# Patient Record
Sex: Male | Born: 1970 | Race: White | Hispanic: No | Marital: Married | Smoking: Former smoker
Health system: Southern US, Community
[De-identification: ages and names within clinical notes are randomized; demographics above are authoritative.]

## PROBLEM LIST (undated history)

## (undated) DIAGNOSIS — J45909 Unspecified asthma, uncomplicated: Secondary | ICD-10-CM

## (undated) DIAGNOSIS — G4730 Sleep apnea, unspecified: Secondary | ICD-10-CM

## (undated) DIAGNOSIS — J189 Pneumonia, unspecified organism: Secondary | ICD-10-CM

## (undated) DIAGNOSIS — M1990 Unspecified osteoarthritis, unspecified site: Secondary | ICD-10-CM

## (undated) DIAGNOSIS — Z87442 Personal history of urinary calculi: Secondary | ICD-10-CM

## (undated) DIAGNOSIS — M75101 Unspecified rotator cuff tear or rupture of right shoulder, not specified as traumatic: Secondary | ICD-10-CM

## (undated) DIAGNOSIS — E785 Hyperlipidemia, unspecified: Secondary | ICD-10-CM

## (undated) HISTORY — PX: KNEE SURGERY: SHX244

## (undated) HISTORY — DX: Hyperlipidemia, unspecified: E78.5

## (undated) HISTORY — DX: Unspecified osteoarthritis, unspecified site: M19.90

## (undated) HISTORY — DX: Sleep apnea, unspecified: G47.30

## (undated) HISTORY — DX: Unspecified asthma, uncomplicated: J45.909

---
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
## 1998-08-05 ENCOUNTER — Ambulatory Visit (HOSPITAL_COMMUNITY): Admission: RE | Admit: 1998-08-05 | Discharge: 1998-08-05 | Payer: Self-pay | Admitting: Family Medicine

## 1998-08-05 ENCOUNTER — Encounter: Payer: Self-pay | Admitting: Family Medicine

## 2004-02-12 ENCOUNTER — Emergency Department: Payer: Self-pay | Admitting: Emergency Medicine

## 2004-03-18 ENCOUNTER — Ambulatory Visit (HOSPITAL_COMMUNITY): Admission: RE | Admit: 2004-03-18 | Discharge: 2004-03-18 | Payer: Self-pay | Admitting: Orthopedic Surgery

## 2006-01-09 HISTORY — PX: KNEE SURGERY: SHX244

## 2011-08-04 ENCOUNTER — Encounter (HOSPITAL_COMMUNITY): Payer: Self-pay | Admitting: Emergency Medicine

## 2011-08-04 ENCOUNTER — Observation Stay (HOSPITAL_COMMUNITY)
Admission: EM | Admit: 2011-08-04 | Discharge: 2011-08-05 | Disposition: A | Discharge status: Home / Self Care | Payer: 59 | Attending: Emergency Medicine | Admitting: Emergency Medicine

## 2011-08-04 DIAGNOSIS — M7989 Other specified soft tissue disorders: Principal | ICD-10-CM | POA: Insufficient documentation

## 2011-08-04 DIAGNOSIS — R110 Nausea: Secondary | ICD-10-CM | POA: Insufficient documentation

## 2011-08-04 DIAGNOSIS — L0390 Cellulitis, unspecified: Secondary | ICD-10-CM

## 2011-08-04 LAB — BASIC METABOLIC PANEL
BUN: 13 mg/dL (ref 6–23)
CO2: 26 mEq/L (ref 19–32)
Calcium: 9.3 mg/dL (ref 8.4–10.5)
Chloride: 97 mEq/L (ref 96–112)
Creatinine, Ser: 0.88 mg/dL (ref 0.50–1.35)
GFR calc Af Amer: >90 mL/min (ref >90)
GFR calc non Af Amer: >90 mL/min (ref >90)
Glucose, Bld: 102 mg/dL — ABNORMAL HIGH (ref 70–99)
Potassium: 3.9 mEq/L (ref 3.5–5.1)
Sodium: 135 mEq/L (ref 135–145)

## 2011-08-04 LAB — CBC
HCT: 40.5 % (ref 39.0–52.0)
Hemoglobin: 14.5 g/dL (ref 13.0–17.0)
MCH: 31.9 pg (ref 26.0–34.0)
MCHC: 35.8 g/dL (ref 30.0–36.0)
MCV: 89.2 fL (ref 78.0–100.0)
Platelets: 328 10*3/uL (ref 150–400)
RBC: 4.54 MIL/uL (ref 4.22–5.81)
RDW: 11.9 % (ref 11.5–15.5)
WBC: 14.4 10*3/uL — ABNORMAL HIGH (ref 4.0–10.5)

## 2011-08-04 MED ORDER — MORPHINE SULFATE 4 MG/ML IJ SOLN: 4.0000 mg | INTRAMUSCULAR | Status: DC | PRN

## 2011-08-04 MED ORDER — ENOXAPARIN SODIUM 150 MG/ML ~~LOC~~ SOLN
1.5000 mg/kg | SUBCUTANEOUS | Status: DC
  Administered 2011-08-04: 130 mg via SUBCUTANEOUS
  Filled 2011-08-04: qty 1

## 2011-08-04 MED ORDER — ONDANSETRON HCL 4 MG/2ML IJ SOLN
4.0000 mg | Freq: Four times a day (QID) | INTRAMUSCULAR | Status: DC | PRN
Start: 2011-08-04 — End: 2011-08-05

## 2011-08-04 MED ORDER — ACETAMINOPHEN 325 MG PO TABS: 650.0000 mg | ORAL_TABLET | ORAL | Status: DC | PRN

## 2011-08-04 MED ORDER — HYDROCODONE-ACETAMINOPHEN 5-325 MG PO TABS
1.0000 | ORAL_TABLET | Freq: Once | ORAL | Status: AC
  Administered 2011-08-04: 1 via ORAL
  Filled 2011-08-04: qty 1

## 2011-08-04 NOTE — ED Notes (Signed)
PT. REFUSED IV ACCESS AT THIS TIME , STATES " I'M NOT IN PAIN ".

## 2011-08-04 NOTE — ED Notes (Signed)
Patient to [facility] ED with C/O swelling and redness in his right lower leg for 1 week.  Swelling waxes and wanes.  Patient states that his leg was more swollen and red yesterday.  Patient stated that he applied ice and the swelling went down.  Denies dyspnea. Right leg noted to be swollen from knee to ankle.

## 2011-08-04 NOTE — ED Provider Notes (Signed)
Medical screening examination/treatment/procedure(s) were conducted as a shared visit with non-physician practitioner(s) and myself.  I personally evaluated the patient during the encounter    Nelia Shi, MD 08/04/11 2212

## 2011-08-04 NOTE — ED Notes (Signed)
S. WOLFE PA AT BEDSIDE SPEAKING WITH PT. AND FAMILY ON PLAN OF CARE .

## 2011-08-04 NOTE — ED Notes (Signed)
C/o R leg swelling, pain, and warm to touch since last Thursday.  No known injury. 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

## 2011-08-04 NOTE — ED Provider Notes (Signed)
History     CSN: [id number]  Arrival date & time 08/04/11  Jordan Shaffer   First MD Initiated Contact with Patient 08/04/11 1848      Chief Complaint  Patient presents with  . Leg Swelling    (Consider location/radiation/quality/duration/timing/severity/associated sxs/prior treatment) The history is provided by the patient.  41 y/o M presents to ED with c/c RLE swelling x 8 days. 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 Waxing and waning somewhat, but generally worsening over time. Began around the right knee and has spread to encompass the entire extremity. Assoc with erythema, excess warmth. Has been feeling generally run down for the last couple of days, but denies any fever or chills. Did have nausea yesterday, resolved today. Denies weakness or numbness to the leg/foot. Pain worse with ambulation, palpation of extremity. No alleviating factors. Has been using ice, ibuprofen with transient improvement. No known personal or family hx DVT/PE. No recent travel or prolonged immobilization, he is a non-smoker. Contacted PCP but they could not see him for another week.  History reviewed. No pertinent past medical history.  Past Surgical History  Procedure Date  . Knee surgery     No family history on file.  History  Substance Use Topics  . Smoking status: Never Smoker   . Smokeless tobacco: Not on file  . Alcohol Use: Yes      Review of Systems 10 systems reviewed and are negative for acute change except as noted in the HPI.  Allergies  Review of patient's allergies indicates no known allergies.  Home Medications   Current Outpatient Rx  Name Route Sig Dispense Refill  . ASPIRIN 81 MG PO CHEW Oral Chew 81 mg by mouth daily.    . IBUPROFEN 200 MG PO TABS Oral Take 200 mg by mouth every 6 (six) hours as needed. For pain    . LANSOPRAZOLE PO Oral Take 1 tablet by mouth daily.    . ADULT MULTIVITAMIN W/MINERALS CH Oral Take 1 tablet by mouth daily.    Marland Kitchen  NAPROXEN SODIUM 220 MG PO TABS Oral Take 220-440 mg by mouth daily as needed. For pain    . OMEGA-3-ACID ETHYL ESTERS 1 G PO CAPS Oral Take 2 g by mouth daily.    Marland Kitchen OVER THE COUNTER MEDICATION Oral Take 2 capsules by mouth daily. CINNAMON 1000MG  WITH CHROMIUM      BP 130/87  Pulse 89  Temp 98.1 F (36.7 C) (Oral)  SpO2 97%  Physical Exam  Nursing note reviewed. Constitutional: He is oriented to person, place, and time. He appears well-developed and well-nourished. No distress.       Vital signs are reviewed and are normal.  HENT:  Head: Normocephalic and atraumatic.  Right Ear: External ear normal.  Left Ear: External ear normal.       MMM  Eyes: Conjunctivae are normal.  Neck: Neck supple.  Cardiovascular: Normal rate, regular rhythm and normal heart sounds.        Bilateral radial and DP pulses are 2+  Pulmonary/Chest: Effort normal and breath sounds normal. No respiratory distress. He has no wheezes.  Abdominal: Soft. He exhibits no distension. There is no tenderness.  Musculoskeletal: Normal range of motion. He exhibits edema and tenderness.       RLE with 1+pitting edema from knee to ankle, erythema circumferentially from mid-thigh to ankle with mod TTP to calf.   Neurological: He is alert and oriented to person, place, and time.  Sensation intact to light touch in BLE. MAEW. Strength preserved in major muscle groups of BLE.  Skin: Skin is warm and dry. There is erythema.    ED Course  Procedures (including critical care time)  Labs Reviewed  CBC - Abnormal; Notable for the following:    WBC 14.4 (*)     All other components within normal limits  BASIC METABOLIC PANEL - Abnormal; Notable for the following:    Glucose, Bld 102 (*)     All other components within normal limits  D-DIMER, QUANTITATIVE - Abnormal; Notable for the following:    D-Dimer, Quant 0.86 (*)     All other components within normal limits  PROTIME-INR  APTT   No results found.     MDM   RLE swelling in fairly healthy male with no known injury. Waxing/waning nature and no obvious wounds, not c/w cellulitis. No predisposing RF for DVT, but clinical exam concerning, Wells criteria 2, d-dimer elevated. Pt is placed on DVT protocol with administration of 1.5mg /kg lovenox and plan for venous duplex in AM. Afebrile but slight leukocytosis, plan for CBC recheck in AM. Pt and family agreeable with this plan. Report given to PA Narvaez in CDU.        Shaaron Adler, [place] 08/04/11 2205

## 2011-08-04 NOTE — ED Notes (Signed)
PA at bedside.

## 2011-08-05 DIAGNOSIS — M7989 Other specified soft tissue disorders: Secondary | ICD-10-CM

## 2011-08-05 DIAGNOSIS — M79609 Pain in unspecified limb: Secondary | ICD-10-CM

## 2011-08-05 LAB — CBC
Hemoglobin: 14 g/dL (ref 13.0–17.0)
MCH: 32.7 pg (ref 26.0–34.0)
MCV: 89.5 fL (ref 78.0–100.0)
RBC: 4.28 MIL/uL (ref 4.22–5.81)

## 2011-08-05 MED ORDER — SULFAMETHOXAZOLE-TRIMETHOPRIM 800-160 MG PO TABS: 1.0000 | ORAL_TABLET | Freq: Two times a day (BID) | ORAL | Status: AC

## 2011-08-05 MED ORDER — CEPHALEXIN 500 MG PO CAPS: 500.0000 mg | ORAL_CAPSULE | Freq: Four times a day (QID) | ORAL | Status: AC

## 2011-08-05 NOTE — Progress Notes (Signed)
VASCULAR LAB PRELIMINARY  PRELIMINARY  PRELIMINARY  PRELIMINARY  Right lower extremity venous Doppler completed.    Preliminary report:  There is no DVT or SVT noted in the right lower extremity.  Jordan Shaffer, 08/05/2011, 7:49 AM

## 2011-08-05 NOTE — ED Notes (Signed)
Pt states his mother suggested to him that he may have strep in his knee.  Pt's mother was diagnosed with the same in the past.

## 2011-08-05 NOTE — ED Provider Notes (Signed)
Jordan Shaffer is a 41 y.o. male be evaluated for right leg swelling, and redness. Doppler study ordered last night, is negative for DVT. He has had mild, nausea without vomiting. No fever, or chills. No preceding or causative factors are known. He has not previously had this problem. Right leg is diffusey swollen from above the knee to the foot. He has intact distal pulses, perfusion and sensation. There is no right thigh, or groin swelling. The right foot is normal; there is no evident portal for infection. No palpable groin adenopathy. He is nontoxic in appearance.  Medical decision-making: Nonspecific Right leg Swelling. Differential diagnosis includes superficial thrombophlebitis, cellulitis, bursitis. We'll cover for cellulitis. Doubt metabolic instability, serious bacterial infection or impending vascular collapse; the patient is stable for discharge.  Plan: Home Medications- Septra, Keflex; Home Treatments- strict elevation; Recommended follow up- PCP in 5 days as scheduled  Flint Melter, MD 08/05/11 1016

## 2011-08-05 NOTE — ED Notes (Signed)
Ordered breakfast tray  

## 2017-02-15 ENCOUNTER — Ambulatory Visit: Payer: Managed Care, Other (non HMO) | Admitting: Urology

## 2017-02-15 ENCOUNTER — Encounter: Payer: Self-pay | Admitting: Urology

## 2017-02-15 VITALS — BP 126/69 | HR 73 | Ht 69.0 in | Wt 190.0 lb

## 2017-02-15 DIAGNOSIS — N529 Male erectile dysfunction, unspecified: Secondary | ICD-10-CM

## 2017-02-15 MED ORDER — SILDENAFIL CITRATE 20 MG PO TABS: ORAL_TABLET | ORAL | 0 refills | Status: DC

## 2017-02-15 NOTE — Progress Notes (Signed)
02/15/2017 4:09 PM   Jordan Shaffer 1970-06-12 [id number]  Referring provider: Evon SlackGaines, Thomas C, PA-C [address] [place], [place] [id number]  Chief Complaint  Patient presents with  . Erectile Dysfunction    New Patient    HPI: 47 year old male seen in consultation at the request of Cranston NeighborChris Gaines PA-C for evaluation of erectile dysfunction.  He has a history of chronic low back pain and underwent bilateral L3 and L4 medial branch, and L5 dorsal ramus radiofrequency neurotomy in early December 2018.  He states after the procedure he had voiding difficulty and difficulty achieving and maintaining an erection.  His voiding symptoms subsequently resolved.  He currently complains of partial erections which are typically not firm enough for penetration.  He has had no prior problems with the ED.  He denies diabetes, hypertension.  He has a history of tobacco use is at 1-2 cigars/day for 2 years and quit 14 years ago.   PMH: Past Medical History:  Diagnosis Date  . Arthritis   . Asthma   . Hyperlipemia   . Sleep apnea     Surgical History: Past Surgical History:  Procedure Laterality Date  . KNEE SURGERY      Home Medications:  Allergies as of 02/15/2017   No Known Allergies     Medication List        Accurate as of 02/15/17  4:09 PM. Always use your most recent med list.          aspirin 81 MG chewable tablet Chew 81 mg by mouth daily.   multivitamin with minerals Tabs tablet Take 1 tablet by mouth daily.   naproxen sodium 220 MG tablet Commonly known as:  ALEVE Take 220-440 mg by mouth daily as needed. For pain       Allergies: No Known Allergies  Family History: Family History  Problem Relation Age of Onset  . Diabetes Father   . Heart disease Father     Social History:  reports that  has never smoked. he has never used smokeless tobacco. He reports that he drinks alcohol. He reports that he does not use drugs.  ROS: UROLOGY Frequent Urination?:  No Hard to postpone urination?: No Burning/pain with urination?: Yes Get up at night to urinate?: No Leakage of urine?: Yes Urine stream starts and stops?: No Trouble starting stream?: No Do you have to strain to urinate?: No Blood in urine?: No Urinary tract infection?: No Sexually transmitted disease?: No Injury to kidneys or bladder?: No Painful intercourse?: Yes Weak stream?: No Erection problems?: Yes Penile pain?: Yes  Gastrointestinal Nausea?: Yes Vomiting?: No Indigestion/heartburn?: Yes Diarrhea?: No Constipation?: No  Constitutional Fever: No Night sweats?: No Weight loss?: No Fatigue?: Yes  Skin Skin rash/lesions?: No Itching?: No  Eyes Blurred vision?: No Double vision?: No  Ears/Nose/Throat Sore throat?: No Sinus problems?: No  Hematologic/Lymphatic Swollen glands?: No Easy bruising?: No  Cardiovascular Leg swelling?: No Chest pain?: No  Respiratory Cough?: No Shortness of breath?: Yes  Endocrine Excessive thirst?: Yes  Musculoskeletal Back pain?: Yes Joint pain?: Yes  Neurological Headaches?: No Dizziness?: No  Psychologic Depression?: No Anxiety?: Yes  Physical Exam: BP 126/69   Pulse 73   Ht 5\' 9"  (1.753 m)   Wt 190 lb (86.2 kg)   BMI 28.06 kg/m   Constitutional:  Alert and oriented, No acute distress. HEENT: [place] AT, moist mucus membranes.  Trachea midline, no masses. Cardiovascular: No clubbing, cyanosis, or edema. Respiratory: Normal respiratory effort, no increased  work of breathing. GI: Abdomen is soft, nontender, nondistended, no abdominal masses GU: No CVA tenderness.  Penis without lesions.  No palpable plaques.  Testes descended bilaterally without masses or tenderness.  Prostate 35 g, smooth without nodules.  Bulbocavernosus reflex intact. Skin: No rashes, bruises or suspicious lesions. Lymph: No cervical or inguinal adenopathy. Neurologic: Grossly intact, no focal deficits, moving all 4  extremities. Psychiatric: Normal mood and affect.  Laboratory Data: Lab Results  Component Value Date   WBC 11.3 (H) 08/05/2011   HGB 14.0 08/05/2011   HCT 38.3 (L) 08/05/2011   MCV 89.5 08/05/2011   PLT 310 08/05/2011    Lab Results  Component Value Date   CREATININE 0.88 08/04/2011     Assessment & Plan:  47 year old male with acute onset erectile dysfunction occurring after bilateral L3 and L4 medial branch, and L5 dorsal ramus radiofrequency neurotomy.  His MRI also showed lumbosacral degenerative disc disease.  In a brief literature search I was unable to find any articles regarding ED as a complication of this procedure.  His lumbosacral degenerative disc disease would be more of a factor.  I did discuss treatment options and that most patients initially desire a medication trial.  He was interested in pursuing an Rx for generic sildenafil was sent to his pharmacy.  Follow-up approximately 1 month.    Riki Altes, MD  [facility] [address] [place], [place] [phone]

## 2017-02-22 ENCOUNTER — Encounter: Payer: Self-pay | Admitting: Urology

## 2018-02-24 ENCOUNTER — Emergency Department
Admission: EM | Admit: 2018-02-24 | Discharge: 2018-02-24 | Disposition: A | Discharge status: Home / Self Care | Payer: Managed Care, Other (non HMO) | Attending: Emergency Medicine | Admitting: Emergency Medicine

## 2018-02-24 ENCOUNTER — Encounter: Payer: Self-pay | Admitting: Emergency Medicine

## 2018-02-24 ENCOUNTER — Other Ambulatory Visit: Payer: Self-pay

## 2018-02-24 DIAGNOSIS — Y9389 Activity, other specified: Secondary | ICD-10-CM | POA: Diagnosis not present

## 2018-02-24 DIAGNOSIS — Z7982 Long term (current) use of aspirin: Secondary | ICD-10-CM | POA: Insufficient documentation

## 2018-02-24 DIAGNOSIS — S0993XA Unspecified injury of face, initial encounter: Secondary | ICD-10-CM | POA: Diagnosis present

## 2018-02-24 DIAGNOSIS — K047 Periapical abscess without sinus: Secondary | ICD-10-CM | POA: Insufficient documentation

## 2018-02-24 DIAGNOSIS — J45909 Unspecified asthma, uncomplicated: Secondary | ICD-10-CM | POA: Diagnosis not present

## 2018-02-24 DIAGNOSIS — K0889 Other specified disorders of teeth and supporting structures: Secondary | ICD-10-CM

## 2018-02-24 DIAGNOSIS — S025XXA Fracture of tooth (traumatic), initial encounter for closed fracture: Secondary | ICD-10-CM | POA: Diagnosis not present

## 2018-02-24 DIAGNOSIS — S025XXB Fracture of tooth (traumatic), initial encounter for open fracture: Secondary | ICD-10-CM

## 2018-02-24 DIAGNOSIS — Y999 Unspecified external cause status: Secondary | ICD-10-CM | POA: Diagnosis not present

## 2018-02-24 DIAGNOSIS — X58XXXA Exposure to other specified factors, initial encounter: Secondary | ICD-10-CM | POA: Insufficient documentation

## 2018-02-24 DIAGNOSIS — Y929 Unspecified place or not applicable: Secondary | ICD-10-CM | POA: Diagnosis not present

## 2018-02-24 MED ORDER — AMOXICILLIN-POT CLAVULANATE 875-125 MG PO TABS: 1.0000 | ORAL_TABLET | Freq: Two times a day (BID) | ORAL | 0 refills | Status: DC

## 2018-02-24 MED ORDER — HYDROCODONE-ACETAMINOPHEN 5-325 MG PO TABS: 1.0000 | ORAL_TABLET | Freq: Three times a day (TID) | ORAL | 0 refills | Status: DC | PRN

## 2018-02-24 MED ORDER — KETOROLAC TROMETHAMINE 10 MG PO TABS: 10.0000 mg | ORAL_TABLET | Freq: Three times a day (TID) | ORAL | 0 refills | Status: DC | PRN

## 2018-02-24 NOTE — ED Provider Notes (Signed)
[facility] Emergency Department Provider Note ____________________________________________  Time seen: 1010  I have reviewed the triage vital signs and the nursing notes.  HISTORY  Chief Complaint  Dental Pain   HPI Jordan Shaffer is a 48 y.o. male presents to the ER today with complaint of left lower jaw pain and swelling.  He reports he broke a tooth about 1 week ago while eating an M&M.  He is scheduled to have a root canal on Tuesday.  He reports the back pain became so unbearable that he could not wait.  He describes the pain as throbbing.  The pain radiates into his left ear.  He denies ear pain, drainage or decreased hearing.  He denies fever but has had chills.  He has taken Tylenol and Ibuprofen OTC with minimal relief.  Past Medical History:  Diagnosis Date  . Arthritis   . Asthma   . Hyperlipemia   . Sleep apnea     There are no active problems to display for this patient.   Past Surgical History:  Procedure Laterality Date  . KNEE SURGERY      Prior to Admission medications   Medication Sig Start Date End Date Taking? Authorizing Provider  amoxicillin-clavulanate (AUGMENTIN) 875-125 MG tablet Take 1 tablet by mouth 2 (two) times daily. 02/24/18   Lorre Munroe, NP  aspirin 81 MG chewable tablet Chew 81 mg by mouth daily.    [provider]  ketorolac (TORADOL) 10 MG tablet Take 1 tablet (10 mg total) by mouth every 8 (eight) hours as needed. 02/24/18   Lorre Munroe, NP  Multiple Vitamin (MULTIVITAMIN WITH MINERALS) TABS Take 1 tablet by mouth daily.    [provider]  naproxen sodium (ANAPROX) 220 MG tablet Take 220-440 mg by mouth daily as needed. For pain    [provider]  sildenafil (REVATIO) 20 MG tablet 2-5 tabs 1 hour prior to intercourse 02/15/17   Stoioff, Verna Czech, MD    Allergies Patient has no known allergies.  Family History  Problem Relation Age of Onset  . Diabetes Father   . Heart disease  Father     Social History Social History   Tobacco Use  . Smoking status: Never Smoker  . Smokeless tobacco: Never Used  Substance Use Topics  . Alcohol use: Yes  . Drug use: No    Review of Systems  Constitutional: Positive for chills.  Negative for fever or body aches. ENT: Positive for left lower jaw pain and swelling.  Negative for sore throat. Cardiovascular: Negative for chest pain or chest tightness. Respiratory: Negative for cough or shortness of breath. G____________________________________________  PHYSICAL EXAM:  VITAL SIGNS: ED Triage Vitals  Enc Vitals Group     BP 02/24/18 0840 (!) 147/95     Pulse Rate 02/24/18 0840 76     Resp 02/24/18 0840 18     Temp 02/24/18 0854 98.2 F (36.8 C)     Temp Source 02/24/18 0854 Oral     SpO2 02/24/18 0840 99 %     Weight 02/24/18 0842 194 lb (88 kg)     Height 02/24/18 0842 5\' 9"  (1.753 m)     Head Circumference --      Peak Flow --      Pain Score 02/24/18 0840 7     Pain Loc --      Pain Edu? --      Excl. in GC? --     Constitutional:  Alert and oriented. Well appearing and in no distress. Head: Normocephalic without sinus tenderness. Ears: Canals clear. TMs intact bilaterally. Mouth/Throat: Mucous membranes are moist.  Broken tooth #19, left lower jaw with gingival swelling and redness. Hematological/Lymphatic/Immunological: No cervical lymphadenopathy. Cardiovascular: Normal rate, regular rhythm.  Respiratory: Normal respiratory effort. No wheezes/rales/rhonchi. Neurologic:   Normal speech and language. No gross focal neurologic deficits are appreciated. ____________________________________________  INITIAL IMPRESSION / ASSESSMENT AND PLAN / ED COURSE  Dental Pain secondary to Broken/Abscessed Tooth:  RX for Augmentin 875-125 mg PO BID x 10 days RT for Toradol 10 mg TID prn Can rinse mouth with salt water BID Follow up with dentist as scheduled ____________________________________________  FINAL  CLINICAL IMPRESSION(S) / ED DIAGNOSES  Final diagnoses:  Pain, dental  Open fracture of tooth, initial encounter  Dental abscess   Nicki Reaper, NP    Lorre Munroe, NP 02/24/18 1028    Nita Sickle, MD 02/24/18 [phone]

## 2018-02-24 NOTE — ED Triage Notes (Signed)
Pt presents to ED c/o pain and mild swelling to L lower teeth. States he is scheduled for root canal next Tuesday but pain became too severe to tolerate.

## 2018-02-24 NOTE — ED Notes (Signed)
Unable to obtain temp, pt states he was drinking cold Coke prior to entering lobby.

## 2018-02-24 NOTE — Discharge Instructions (Addendum)
You were seen today for a broken/abscessed tooth.  I have given you a prescription for antibiotics to take twice daily for the next 10 days.  I am giving you a prescription for pain medication to take every 8 hours as needed for pain and inflammation.  Please avoid OTC anti-inflammatory medications while taking Etodolac.  Follow-up with your dentist on Tuesday.

## 2018-10-22 ENCOUNTER — Other Ambulatory Visit (HOSPITAL_COMMUNITY)
Admission: RE | Admit: 2018-10-22 | Discharge: 2018-10-22 | Disposition: A | Discharge status: Home / Self Care | Payer: Managed Care, Other (non HMO) | Source: Ambulatory Visit | Attending: Orthopedic Surgery | Admitting: Orthopedic Surgery

## 2018-10-22 ENCOUNTER — Encounter (HOSPITAL_COMMUNITY): Payer: Self-pay | Admitting: *Deleted

## 2018-10-22 ENCOUNTER — Other Ambulatory Visit: Payer: Self-pay

## 2018-10-22 DIAGNOSIS — Z01812 Encounter for preprocedural laboratory examination: Secondary | ICD-10-CM | POA: Diagnosis not present

## 2018-10-22 DIAGNOSIS — Z20828 Contact with and (suspected) exposure to other viral communicable diseases: Secondary | ICD-10-CM | POA: Insufficient documentation

## 2018-10-22 LAB — SARS CORONAVIRUS 2 (TAT 6-24 HRS): SARS Coronavirus 2: NEGATIVE

## 2018-10-22 NOTE — Progress Notes (Signed)
Pt's wife picked up the Pre-surgery Ensure drink for pt.

## 2018-10-22 NOTE — Progress Notes (Signed)
Spoke with pt for pre-op call. Pt denies hx of Cardiac disease, HTN or Diabetes.  Pt had Covid test done this AM. Pt states he has been in quarantine since and understands he needs to continue until he comes for surgery tomorrow.   No pre-op orders in Epic. Have inboxed Dr. Stann Mainland and spoke with Judeen Hammans at his office. I told the pt that if Dr. Stann Mainland gets his orders in and if pt can have liquids after midnight I would call him and let him know. Otherwise he was instructed to be NPO after midnight tonight.

## 2018-10-22 NOTE — Progress Notes (Signed)
Called pt and instructed him that he may have clear liquids until 9:45 AM tomorrow. He is going to see if his wife can come by the hospital this afternoon by 7 PM and pick up Pre-surgery Ensure. Pt instructed to drink that by 9:45 AM. He voiced understanding.

## 2018-10-23 ENCOUNTER — Encounter (HOSPITAL_COMMUNITY): Payer: Self-pay

## 2018-10-23 ENCOUNTER — Ambulatory Visit (HOSPITAL_COMMUNITY): Payer: Worker's Compensation | Admitting: Certified Registered Nurse Anesthetist

## 2018-10-23 ENCOUNTER — Other Ambulatory Visit: Payer: Self-pay

## 2018-10-23 ENCOUNTER — Ambulatory Visit (HOSPITAL_COMMUNITY)
Admission: RE | Admit: 2018-10-23 | Discharge: 2018-10-23 | Disposition: A | Discharge status: Home / Self Care | Payer: Worker's Compensation | Attending: Orthopedic Surgery | Admitting: Orthopedic Surgery

## 2018-10-23 ENCOUNTER — Encounter (HOSPITAL_COMMUNITY)
Admission: RE | Disposition: A | Discharge status: Home / Self Care | Payer: Self-pay | Source: Home / Self Care | Attending: Orthopedic Surgery

## 2018-10-23 DIAGNOSIS — G4733 Obstructive sleep apnea (adult) (pediatric): Secondary | ICD-10-CM | POA: Insufficient documentation

## 2018-10-23 DIAGNOSIS — M7551 Bursitis of right shoulder: Secondary | ICD-10-CM | POA: Insufficient documentation

## 2018-10-23 DIAGNOSIS — Y990 Civilian activity done for income or pay: Secondary | ICD-10-CM | POA: Diagnosis not present

## 2018-10-23 DIAGNOSIS — Z791 Long term (current) use of non-steroidal anti-inflammatories (NSAID): Secondary | ICD-10-CM | POA: Diagnosis not present

## 2018-10-23 DIAGNOSIS — S46211A Strain of muscle, fascia and tendon of other parts of biceps, right arm, initial encounter: Secondary | ICD-10-CM | POA: Diagnosis not present

## 2018-10-23 DIAGNOSIS — Y9389 Activity, other specified: Secondary | ICD-10-CM | POA: Diagnosis not present

## 2018-10-23 DIAGNOSIS — M7521 Bicipital tendinitis, right shoulder: Secondary | ICD-10-CM | POA: Insufficient documentation

## 2018-10-23 DIAGNOSIS — E785 Hyperlipidemia, unspecified: Secondary | ICD-10-CM | POA: Diagnosis not present

## 2018-10-23 DIAGNOSIS — Z79899 Other long term (current) drug therapy: Secondary | ICD-10-CM | POA: Insufficient documentation

## 2018-10-23 DIAGNOSIS — Z9119 Patient's noncompliance with other medical treatment and regimen: Secondary | ICD-10-CM | POA: Diagnosis not present

## 2018-10-23 DIAGNOSIS — M7541 Impingement syndrome of right shoulder: Secondary | ICD-10-CM | POA: Diagnosis not present

## 2018-10-23 DIAGNOSIS — M25811 Other specified joint disorders, right shoulder: Secondary | ICD-10-CM | POA: Insufficient documentation

## 2018-10-23 DIAGNOSIS — M19011 Primary osteoarthritis, right shoulder: Secondary | ICD-10-CM | POA: Insufficient documentation

## 2018-10-23 DIAGNOSIS — W1789XA Other fall from one level to another, initial encounter: Secondary | ICD-10-CM | POA: Insufficient documentation

## 2018-10-23 DIAGNOSIS — Z87891 Personal history of nicotine dependence: Secondary | ICD-10-CM | POA: Insufficient documentation

## 2018-10-23 DIAGNOSIS — M75101 Unspecified rotator cuff tear or rupture of right shoulder, not specified as traumatic: Secondary | ICD-10-CM | POA: Diagnosis present

## 2018-10-23 DIAGNOSIS — J45909 Unspecified asthma, uncomplicated: Secondary | ICD-10-CM | POA: Insufficient documentation

## 2018-10-23 DIAGNOSIS — S46011A Strain of muscle(s) and tendon(s) of the rotator cuff of right shoulder, initial encounter: Secondary | ICD-10-CM | POA: Insufficient documentation

## 2018-10-23 HISTORY — DX: Pneumonia, unspecified organism: J18.9

## 2018-10-23 HISTORY — DX: Personal history of urinary calculi: Z87.442

## 2018-10-23 HISTORY — PX: SHOULDER ARTHROSCOPY WITH ROTATOR CUFF REPAIR AND SUBACROMIAL DECOMPRESSION: SHX5686

## 2018-10-23 HISTORY — DX: Unspecified rotator cuff tear or rupture of right shoulder, not specified as traumatic: M75.101

## 2018-10-23 LAB — HEMOGLOBIN: Hemoglobin: 15.4 g/dL (ref 13.0–17.0)

## 2018-10-23 SURGERY — SHOULDER ARTHROSCOPY WITH ROTATOR CUFF REPAIR AND SUBACROMIAL DECOMPRESSION
Anesthesia: General | Site: Shoulder | Laterality: Right

## 2018-10-23 MED ORDER — CEFAZOLIN SODIUM-DEXTROSE 2-4 GM/100ML-% IV SOLN
2.0000 g | INTRAVENOUS | Status: AC
  Administered 2018-10-23: 2 g via INTRAVENOUS

## 2018-10-23 MED ORDER — FENTANYL CITRATE (PF) 100 MCG/2ML IJ SOLN
INTRAMUSCULAR | Status: AC
  Administered 2018-10-23: 50 ug via INTRAVENOUS
  Filled 2018-10-23: qty 2

## 2018-10-23 MED ORDER — FENTANYL CITRATE (PF) 100 MCG/2ML IJ SOLN
50.0000 ug | Freq: Once | INTRAMUSCULAR | Status: AC
  Administered 2018-10-23: 12:00:00 50 ug via INTRAVENOUS

## 2018-10-23 MED ORDER — SUGAMMADEX SODIUM 200 MG/2ML IV SOLN
INTRAVENOUS | Status: DC | PRN
  Administered 2018-10-23: 200 mg via INTRAVENOUS

## 2018-10-23 MED ORDER — DEXAMETHASONE SODIUM PHOSPHATE 10 MG/ML IJ SOLN
INTRAMUSCULAR | Status: AC
  Filled 2018-10-23: qty 1

## 2018-10-23 MED ORDER — OXYCODONE HCL 5 MG PO TABS: 5.0000 mg | ORAL_TABLET | ORAL | 0 refills | Status: AC | PRN

## 2018-10-23 MED ORDER — ROCURONIUM BROMIDE 50 MG/5ML IV SOSY
PREFILLED_SYRINGE | INTRAVENOUS | Status: DC | PRN
  Administered 2018-10-23: 60 mg via INTRAVENOUS

## 2018-10-23 MED ORDER — MIDAZOLAM HCL 2 MG/2ML IJ SOLN
2.0000 mg | Freq: Once | INTRAMUSCULAR | Status: AC
  Administered 2018-10-23: 12:00:00 2 mg via INTRAVENOUS

## 2018-10-23 MED ORDER — CEFAZOLIN SODIUM-DEXTROSE 2-4 GM/100ML-% IV SOLN
INTRAVENOUS | Status: AC
  Filled 2018-10-23: qty 100

## 2018-10-23 MED ORDER — BUPIVACAINE HCL (PF) 0.5 % IJ SOLN
INTRAMUSCULAR | Status: DC | PRN
  Administered 2018-10-23: 15 mL via PERINEURAL

## 2018-10-23 MED ORDER — SODIUM CHLORIDE 0.9 % IV SOLN
INTRAVENOUS | Status: DC | PRN
  Administered 2018-10-23: 10 ug/min via INTRAVENOUS

## 2018-10-23 MED ORDER — ONDANSETRON HCL 4 MG/2ML IJ SOLN: 4.0000 mg | Freq: Once | INTRAMUSCULAR | Status: DC | PRN

## 2018-10-23 MED ORDER — 0.9 % SODIUM CHLORIDE (POUR BTL) OPTIME
TOPICAL | Status: DC | PRN
  Administered 2018-10-23: 1000 mL

## 2018-10-23 MED ORDER — LIDOCAINE 2% (20 MG/ML) 5 ML SYRINGE
INTRAMUSCULAR | Status: DC | PRN
  Administered 2018-10-23: 60 mg via INTRAVENOUS

## 2018-10-23 MED ORDER — ONDANSETRON 4 MG PO TBDP: 4.0000 mg | ORAL_TABLET | Freq: Three times a day (TID) | ORAL | 0 refills | Status: AC | PRN

## 2018-10-23 MED ORDER — LACTATED RINGERS IV SOLN
INTRAVENOUS | Status: DC
  Administered 2018-10-23 (×2): via INTRAVENOUS

## 2018-10-23 MED ORDER — PROPOFOL 10 MG/ML IV BOLUS
INTRAVENOUS | Status: DC | PRN
  Administered 2018-10-23: 200 mg via INTRAVENOUS

## 2018-10-23 MED ORDER — DEXAMETHASONE SODIUM PHOSPHATE 10 MG/ML IJ SOLN
INTRAMUSCULAR | Status: DC | PRN
  Administered 2018-10-23: 10 mg via INTRAVENOUS

## 2018-10-23 MED ORDER — MIDAZOLAM HCL 2 MG/2ML IJ SOLN
INTRAMUSCULAR | Status: AC
  Administered 2018-10-23: 2 mg via INTRAVENOUS
  Filled 2018-10-23: qty 2

## 2018-10-23 MED ORDER — OXYCODONE HCL 5 MG PO TABS
ORAL_TABLET | ORAL | Status: AC
  Filled 2018-10-23: qty 1

## 2018-10-23 MED ORDER — LIDOCAINE 2% (20 MG/ML) 5 ML SYRINGE
INTRAMUSCULAR | Status: AC
  Filled 2018-10-23: qty 5

## 2018-10-23 MED ORDER — ONDANSETRON HCL 4 MG/2ML IJ SOLN
INTRAMUSCULAR | Status: DC | PRN
  Administered 2018-10-23: 4 mg via INTRAVENOUS

## 2018-10-23 MED ORDER — OXYCODONE HCL 5 MG PO TABS
5.0000 mg | ORAL_TABLET | Freq: Once | ORAL | Status: AC | PRN
  Administered 2018-10-23: 5 mg via ORAL

## 2018-10-23 MED ORDER — BUPIVACAINE LIPOSOME 1.3 % IJ SUSP
INTRAMUSCULAR | Status: DC | PRN
  Administered 2018-10-23: 10 mL

## 2018-10-23 MED ORDER — SODIUM CHLORIDE 0.9 % IR SOLN
Status: DC | PRN
  Administered 2018-10-23 (×6): 3000 mL

## 2018-10-23 MED ORDER — OXYCODONE HCL 5 MG/5ML PO SOLN: 5.0000 mg | Freq: Once | ORAL | Status: AC | PRN

## 2018-10-23 MED ORDER — CHLORHEXIDINE GLUCONATE 4 % EX LIQD: 60.0000 mL | Freq: Once | CUTANEOUS | Status: DC

## 2018-10-23 MED ORDER — ONDANSETRON HCL 4 MG/2ML IJ SOLN
INTRAMUSCULAR | Status: AC
  Filled 2018-10-23: qty 2

## 2018-10-23 MED ORDER — FENTANYL CITRATE (PF) 100 MCG/2ML IJ SOLN: 25.0000 ug | INTRAMUSCULAR | Status: DC | PRN

## 2018-10-23 MED ORDER — FENTANYL CITRATE (PF) 250 MCG/5ML IJ SOLN
INTRAMUSCULAR | Status: AC
  Filled 2018-10-23: qty 5

## 2018-10-23 MED ORDER — PHENYLEPHRINE 40 MCG/ML (10ML) SYRINGE FOR IV PUSH (FOR BLOOD PRESSURE SUPPORT)
PREFILLED_SYRINGE | INTRAVENOUS | Status: AC
  Filled 2018-10-23: qty 10

## 2018-10-23 SURGICAL SUPPLY — 39 items
ALCOHOL 70% 16 OZ (MISCELLANEOUS) ×3 IMPLANT
ANCHOR SUT BIO SW 4.75X19.1 (Anchor) ×12 IMPLANT
BLADE SURG 11 STRL SS (BLADE) ×3 IMPLANT
CANNULA 5.75X7 CRYSTAL CLEAR (CANNULA) ×3 IMPLANT
CANNULA TWIST IN 8.25X7CM (CANNULA) ×3 IMPLANT
CLOSURE WOUND 1/2 X4 (GAUZE/BANDAGES/DRESSINGS) ×1
COVER WAND RF STERILE (DRAPES) IMPLANT
DRAPE INCISE IOBAN 66X45 STRL (DRAPES) IMPLANT
DRAPE STERI 35X30 U-POUCH (DRAPES) ×3 IMPLANT
DRAPE U-SHAPE 47X51 STRL (DRAPES) ×3 IMPLANT
DRSG PAD ABDOMINAL 8X10 ST (GAUZE/BANDAGES/DRESSINGS) ×9 IMPLANT
DURAPREP 26ML APPLICATOR (WOUND CARE) ×3 IMPLANT
GAUZE SPONGE 4X4 12PLY STRL (GAUZE/BANDAGES/DRESSINGS) ×3 IMPLANT
GLOVE BIO SURGEON STRL SZ7.5 (GLOVE) ×3 IMPLANT
GLOVE BIOGEL PI IND STRL 8 (GLOVE) ×1 IMPLANT
GLOVE BIOGEL PI INDICATOR 8 (GLOVE) ×2
GOWN STRL REUS W/ TWL LRG LVL3 (GOWN DISPOSABLE) ×3 IMPLANT
GOWN STRL REUS W/TWL LRG LVL3 (GOWN DISPOSABLE) ×6
KIT BASIN OR (CUSTOM PROCEDURE TRAY) ×3 IMPLANT
KIT TURNOVER KIT B (KITS) ×3 IMPLANT
MANIFOLD NEPTUNE II (INSTRUMENTS) ×3 IMPLANT
NEEDLE SCORPION MULTI FIRE (NEEDLE) ×3 IMPLANT
NEEDLE SPNL 18GX3.5 QUINCKE PK (NEEDLE) ×3 IMPLANT
NS IRRIG 1000ML POUR BTL (IV SOLUTION) ×3 IMPLANT
PACK SHOULDER (CUSTOM PROCEDURE TRAY) ×3 IMPLANT
PAD ARMBOARD 7.5X6 YLW CONV (MISCELLANEOUS) ×6 IMPLANT
PROBE APOLLO 90XL (SURGICAL WAND) ×3 IMPLANT
SLEEVE ARM SUSPENSION SYSTEM (MISCELLANEOUS) ×3 IMPLANT
SPONGE LAP 4X18 RFD (DISPOSABLE) ×3 IMPLANT
STRIP CLOSURE SKIN 1/2X4 (GAUZE/BANDAGES/DRESSINGS) ×2 IMPLANT
SUT ETHILON 3 0 PS 1 (SUTURE) IMPLANT
SUT MNCRL AB 3-0 PS2 27 (SUTURE) ×3 IMPLANT
SUT TIGER TAPE 7 IN WHITE (SUTURE) ×3 IMPLANT
TAPE FIBER 2MM 7IN #2 BLUE (SUTURE) ×3 IMPLANT
TAPE PAPER 3X10 WHT MICROPORE (GAUZE/BANDAGES/DRESSINGS) ×3 IMPLANT
TOWEL GREEN STERILE (TOWEL DISPOSABLE) ×3 IMPLANT
TOWEL GREEN STERILE FF (TOWEL DISPOSABLE) ×3 IMPLANT
TUBING ARTHROSCOPY IRRIG 16FT (MISCELLANEOUS) ×3 IMPLANT
WATER STERILE IRR 1000ML POUR (IV SOLUTION) ×3 IMPLANT

## 2018-10-23 NOTE — Anesthesia Procedure Notes (Signed)
Anesthesia Regional Block: Interscalene brachial plexus block   Pre-Anesthetic Checklist: ,, timeout performed, Correct Patient, Correct Site, Correct Laterality, Correct Procedure, Correct Position, site marked, Risks and benefits discussed,  Surgical consent,  Pre-op evaluation,  At surgeon's request and post-op pain management  Laterality: Right  Prep: chloraprep       Needles:  Injection technique: Single-shot  Needle Type: Echogenic Stimulator Needle     Needle Length: 9cm  Needle Gauge: 21     Additional Needles:   Procedures:,,,, ultrasound used (permanent image in chart),,,,  Narrative:  Start time: 10/23/2018 12:25 PM End time: 10/23/2018 12:32 PM Injection made incrementally with aspirations every 5 mL.  Performed by: Personally  Anesthesiologist: Lidia Collum, MD  Additional Notes: Monitors applied. Injection made in 5cc increments. No resistance to injection. Good needle visualization. Patient tolerated procedure well.

## 2018-10-23 NOTE — Discharge Instructions (Signed)
Orthopedic discharge instructions:  -Maintain postoperative bandages for 3 days.  On your postoperative day #3, which will be this Saturday, you may remove bandages and begin showering.  After your shower he should pat your incisions dry and cover again with dry dressings or Band-Aids.  Do not remove the Steri-Strips.  -Maintain your arm in the sling with the abduction pillow around her waist at all times.  You may remove the arm periodically while awake to let the arm hang from your side.  You may also remove the arm to shower.  Do not actively move the shoulder or lift anything with the right arm.  He should be nonweightbearing to the right arm.  He may begin doing pendulums through the shoulder once her pain allows.  -Apply ice to the right shoulder for 30 minutes/h that you are awake.  -For mild to moderate pain use Tylenol 1000 mg every 6 hours, and/or ibuprofen 600 mg every 6 hours around-the-clock.  For breakthrough pain use the oxycodone as directed.  -Return to see Dr. Stann Mainland in 2 weeks for routine postoperative wound check.

## 2018-10-23 NOTE — Op Note (Signed)
10/23/2018   PATIENT:  Jordan Shaffer    PRE-OPERATIVE DIAGNOSIS:   1.  Right shoulder rotator cuff tear, subscapularis, supraspinatus, and infraspinatus 2.  Right shoulder proximal biceps tendinitis with tearing. 3.  Right shoulder subacromial impingement 4.  Right shoulder degenerative labral tearing anterior 5.  Right shoulder acromioclavicular arthritis  POST-OPERATIVE DIAGNOSIS:  Same  PROCEDURE:  1.  Right shoulder arthroscopic rotator cuff repair of complete tear, supraspinatus and infraspinatus and subscapularis 2.  Right shoulder arthroscopic biceps tenodesis 3.  Right shoulder arthroscopic subacromial decompression without coracoacromial ligament release 4.  Right shoulder arthroscopic distal clavicle resection (Mumford procedure) of approximately 10 mm 5.  Right shoulder arthroscopic limited debridement of anterior labrum, and rotator interval.  SURGEON:  Nicholes Stairs, MD  ASSISTANT: Remer Macho, RNFA  ANESTHESIA:   General  ESTIMATED BLOOD LOSS: 20 cc   PREOPERATIVE INDICATIONS:  Jordan Shaffer is a  48 y.o. male with a diagnosis of Right shoulder rotator cuff tear, biceps tear, impingement syndrome who failed conservative measures and elected for surgical management.    The risks benefits and alternatives were discussed with the patient preoperatively including but not limited to the risks of infection, bleeding, nerve injury, cardiopulmonary complications, the need for revision surgery, among others, and the patient was willing to proceed.  OPERATIVE IMPLANTS: Arthrex bio composite 4.75 mm swivel lock anchors x4  OPERATIVE FINDINGS:   On the intra-articular portion of the procedure he was noted to have no glenohumeral osteoarthritis.  There was degenerative tearing of the anterior, superior labrum complex.  There was flattening of the proximal biceps at the biceps anchor and erythema and tears noted along the upper border of the subscapularis.  There  were longitudinal tears noted of the upper one third of the subscapularis with just minimal retraction.  The supraspinatus and infraspinatus were completely torn from the greater tuberosity.  The tear measured 2.5 cm from anterior to posterior and 2 cm from lateral to medial.  The quality of the leading edge of this tendon tissue was somewhat poor with some underlying tendinopathy.  The teres minor was completely intact.  There were no intra-articular loose bodies.  In the subacromial space he had abundant subacromial bursitis.  There was noted to be the crescent-shaped supraspinatus and infraspinatus tear as noted above.  There was abundant anterior bursitis around a large undersurface spur of the anterior acromium as well as extensive degenerative changes of the acromioclavicular joint.    OPERATIVE PROCEDURE: The patient was brought to the operating room and placed in the supine position. General anesthesia was administered. IV antibiotics were given. General anesthesia was administered.   The upper extremity was examined and found to be grossly unstable particularly to anterior testing.  Range of motion was noted to be symmetric on the right shoulder to the left under his manual examination under anesthesia.  The upper extremity was prepped and draped in the usual sterile fashion. The patient was in a semilateral decubitus position.  Time out was performed. Diagnostic arthroscopy was carried out the above-named findings.   We began the procedure by establishing a posterior viewing portal and the intra-articular shoulder.  A stab incision was made 2 cm distal and 1 cm medial to the posterior lateral corner of the acromion.  The arthroscope was introduced into the joint.  This was done atraumatically.  Next we established a mid glenoid working portal just superior to the upper border of the subscapularis.  This was established via  spinal needle localization.  We also then established an anterior superior  lateral portal through the anterior aspect of the rotator interval.  This was utilized for suture management.  Once we identified all pathologies as noted above we moved ahead with the biceps tenodesis and an intra-articular loop intact technique.   Utilizing the fiber link suture in an antegrade suture passer with the BirdBeak we placed a racking stitch around the biceps tendon that was then locked to itself.  Next we passed a suture through the proximal biceps tendon with the BirdBeak.  The tendon was then tenotomized.  Next we had previously passed a vertical mattress suture through the upper subscapularis with a suture tape.  Utilizing the tails from the stitch and the tail from the biceps stitch we placed into the 4.75 mm Arthrex swivel lock.  The awl was then used to introduce a pilot hole just proximal and superior to the upper border of the subscapularis footprint on the lesser tuberosity.  Next we introduced the anchor into the hole with the sutures from the subscapularis and the biceps tendon in the upper border of the biceps groove.  This was then screwed into place with excellent purchase.  This completed the biceps tenodesis which was noted to be secured.   Next we moved to the limited debridement of the intra-articular joint.  Utilizing the motorized shaver the anterior labrum and rotator interval were debrided.  We also used the radiofrequency wand to address any hemostasis.   Next we moved to the rotator cuff repair.  First and the intra-articular portion of the procedure we utilized the radiofrequency wand to perform release of the cortical humeral ligament from the superior aspect of the rotator interval to allow better excursion of the rotator cuff.  Then we introduced the arthroscope into the subacromial space.  Next a lateral working portal was established about 4 cm distant/lateral to the lateral edge of the acromium and along the posterior aspect of the distal clavicle.  This was done  with spinal needle localization.  We performed releases of the subacromial space and an extensive bursectomy to allow adequate visualization of the rotator cuff tear.  This was noted to be a crescent-shaped tear that was full-thickness and retracted about 2 cm from medial to lateral and covered 2-1/2 cm from anterior to posterior.  The rotator cuff tissue was fair.  We prepared the greater tuberosity with a motorized bur.  We gently decorticated the greater tuberosity and removed any residual rotator cuff tendon stump.  Next we placed a medial row anchor.  This was a single 4.75 mm swivel lock anchor that was preloaded with a fiber tape and one suture tape.  This gave [place] a total of 4 limbs.  This anchor was placed just lateral to the articular margin.  This had excellent purchase.  We passed 4 separate passes with the scorpion suture passer from anterior to posterior.  Next we pulled these to lateral row anchors.  Lateral row anchors were placed lateral to the lateral edge of the greater tuberosity.  The rotator cuff tendon itself had excellent excursion and was able to be brought back to the anatomic footprint in a transosseous double row equivalent.  A posterior lateral row anchor and an anterior lateral row anchor were placed with 2 tails in each anchor and tensioned by hand.  These were both 4.75 mm swivel lock anchors.   Also of note, in the intra-articular portion we did perform an upper border of  the subscapularis tendon repair with a knotless single row anchor technique.  A single suture tape was passed in a vertical mattress fashion along the upper border of the subscapularis, after tissue was released from the anterior surface and posterior surface of the tendon.  This provided excellent traction.  We then placed this into the same anchor as the biceps tenodesis.   Next, we moved to the subacromial decompression.  Utilizing the radiofrequency wand we removed any periosteum and soft tissue attachments  to the anterior lateral acromion.  This identified a large anterior spur.  While viewing from the lateral portal and working from the posterior portal we introduced a motorized bur.  We were able to use a cutting block technique and nicely transition this type II acromion to a flat smooth type I.  Photos were taken.   Lastly we moved to the distal clavicle resection.  Utilizing the arthroscope in the lateral portal and working from the anterior portal the radiofrequency wand was introduced into the acromioclavicular joint.  This was noted to be quite sclerotic with a large undersurface spur.  We removed any soft tissue attachments.  Care was taken to preserve the superior and posterior capsular attachments.  Next we introduced the bur from the anterior portal and removed approximately 10 mm of distal clavicle.  Care was taken again to preserve posterior superior capsule structures.  Pictures were taken after completion.   After photos were taken to document all procedures performed, the arthroscope and working instruments were removed from the shoulder.  Excessive fluid was allowed to egress from the shoulder.  We then closed the portals with 3 oh buried Monocryl's.  The arm was cleaned and dried.  Steri-Strips and a standard sterile drape and bandage were applied.  An abduction pillow sling was applied.  There were no noted intraoperative complications.  All counts were correct x2.  He was awakened from general anesthesia and transported to the PACU in stable condition.    Disposition:  The patient will be nonweightbearing with an abduction sling to the operative extremity.  He may begin scapular retractions and elbow hand and wrist range motion as tolerated.  He will begin physical therapy in 1 week.  I will see them back in the office in 2 weeks for a wound check.  No lifting to the right arm.

## 2018-10-23 NOTE — Brief Op Note (Signed)
10/23/2018  2:56 PM  PATIENT:  Jordan Shaffer  48 y.o. male  PRE-OPERATIVE DIAGNOSIS:  Right shoulder rotator cuff tear, biceps tear, impingement syndrome  POST-OPERATIVE DIAGNOSIS:  Right shoulder rotator cuff tear, biceps tear, impingement syndrome  PROCEDURE:  Procedure(s) with comments: Right shoulder arthroscopic rotator cuff repair, biceps tenodesis, subacromial decompression, distal clavicle resection (Right) - 2.5 hrs  SURGEON:  Surgeon(s) and Role:    * Stann Mainland, Elly Modena, MD - Primary  PHYSICIAN ASSISTANT:   ASSISTANTS: Katy Apo, RNFA   ANESTHESIA:   regional and general  EBL:  20 mL   BLOOD ADMINISTERED:none  DRAINS: none   LOCAL MEDICATIONS USED:  NONE  SPECIMEN:  No Specimen  DISPOSITION OF SPECIMEN:  N/A  COUNTS:  YES  TOURNIQUET:  * No tourniquets in log *  DICTATION: .Note written in EPIC  PLAN OF CARE: Discharge to home after PACU  PATIENT DISPOSITION:  PACU - hemodynamically stable.   Delay start of Pharmacological VTE agent (>24hrs) due to surgical blood loss or risk of bleeding: not applicable

## 2018-10-23 NOTE — Anesthesia Preprocedure Evaluation (Addendum)
Anesthesia Evaluation  Patient identified by MRN, date of birth, ID band Patient awake    Reviewed: Allergy & Precautions, NPO status , Patient's Chart, lab work & pertinent test results  History of Anesthesia Complications Negative for: history of anesthetic complications  Airway Mallampati: II  TM Distance: >3 FB Neck ROM: Full    Dental   Pulmonary asthma , sleep apnea , former smoker,    Pulmonary exam normal        Cardiovascular negative cardio ROS Normal cardiovascular exam     Neuro/Psych negative neurological ROS  negative psych ROS   GI/Hepatic negative GI ROS, Neg liver ROS,   Endo/Other  negative endocrine ROS  Renal/GU negative Renal ROS  negative genitourinary   Musculoskeletal  (+) Arthritis ,   Abdominal   Peds  Hematology negative hematology ROS (+)   Anesthesia Other Findings   Reproductive/Obstetrics                            Anesthesia Physical Anesthesia Plan  ASA: II  Anesthesia Plan: General   Post-op Pain Management: GA combined w/ Regional for post-op pain   Induction: Intravenous  PONV Risk Score and Plan: 2 and Ondansetron, Dexamethasone, Treatment may vary due to age or medical condition and Midazolam  Airway Management Planned: Oral ETT  Additional Equipment: None  Intra-op Plan:   Post-operative Plan: Extubation in OR  Informed Consent: I have reviewed the patients History and Physical, chart, labs and discussed the procedure including the risks, benefits and alternatives for the proposed anesthesia with the patient or authorized representative who has indicated his/her understanding and acceptance.     Dental advisory given  Plan Discussed with:   Anesthesia Plan Comments:        Anesthesia Quick Evaluation

## 2018-10-23 NOTE — Anesthesia Procedure Notes (Signed)
Procedure Name: Intubation Date/Time: 10/23/2018 12:58 PM Performed by: Genelle Bal, CRNA Pre-anesthesia Checklist: Patient identified, Emergency Drugs available, Suction available and Patient being monitored Patient Re-evaluated:Patient Re-evaluated prior to induction Oxygen Delivery Method: Circle system utilized Preoxygenation: Pre-oxygenation with 100% oxygen Induction Type: IV induction Ventilation: Mask ventilation without difficulty Laryngoscope Size: Miller and 2 Grade View: Grade I Tube type: Oral Tube size: 7.5 mm Number of attempts: 1 Airway Equipment and Method: Stylet and Oral airway Placement Confirmation: ETT inserted through vocal cords under direct vision,  positive ETCO2 and breath sounds checked- equal and bilateral Secured at: 21 cm Tube secured with: Tape Dental Injury: Teeth and Oropharynx as per pre-operative assessment

## 2018-10-23 NOTE — Transfer of Care (Signed)
Immediate Anesthesia Transfer of Care Note  Patient: Jordan Shaffer  Procedure(s) Performed: Right shoulder arthroscopic rotator cuff repair, biceps tenodesis, subacromial decompression, distal clavicle resection (Right Shoulder)  Patient Location: PACU  Anesthesia Type:GA combined with regional for post-op pain  Level of Consciousness: awake, alert  and oriented  Airway & Oxygen Therapy: Patient Spontanous Breathing and Patient connected to face mask oxygen  Post-op Assessment: Report given to RN and Post -op Vital signs reviewed and stable  Post vital signs: Reviewed and stable  Last Vitals:  Vitals Value Taken Time  BP 133/78 10/23/18 1507  Temp    Pulse 60 10/23/18 1508  Resp 16 10/23/18 1508  SpO2 100 % 10/23/18 1508  Vitals shown include unvalidated device data.  Last Pain:  Vitals:   10/23/18 1235  TempSrc:   PainSc: 0-No pain         Complications: No apparent anesthesia complications

## 2018-10-23 NOTE — H&P (Signed)
ORTHOPAEDIC H and P  REQUESTING PHYSICIAN: Nicholes Stairs, MD  PCP:  Maryland Pink, MD  Chief Complaint: Right shoulder pain.   HPI: Jordan Shaffer is a 48 y.o. male who complains of right shoulder pain and weakness following a work-related injury a few weeks back.  He fell through a attic access point.  He landed on his left knee and right shoulder.  He continues with some left knee pain that we will investigate further in the future.  He is here today for operative intervention on his massive right shoulder rotator cuff tear.  We have previously discussed this recommendation in the office and solicited and answered all questions to his satisfaction.  This is a Designer, jewellery injury and subsequent surgery.  Past Medical History:  Diagnosis Date  . Arthritis   . Asthma   . History of kidney stones   . Hyperlipemia   . Pneumonia    walking pneumonia  . Sleep apnea    does not use cpap   Past Surgical History:  Procedure Laterality Date  . KNEE SURGERY Left 2008   left, arthroscopic, torn meniscus   Social History   Socioeconomic History  . Marital status: Married    Spouse name: Not on file  . Number of children: Not on file  . Years of education: Not on file  . Highest education level: Not on file  Occupational History  . Not on file  Social Needs  . Financial resource strain: Not on file  . Food insecurity    Worry: Not on file    Inability: Not on file  . Transportation needs    Medical: Not on file    Non-medical: Not on file  Tobacco Use  . Smoking status: Former Research scientist (life sciences)  . Smokeless tobacco: Never Used  . Tobacco comment: quit 15 years ago  Substance and Sexual Activity  . Alcohol use: Yes    Comment: occasional  . Drug use: No  . Sexual activity: Not on file  Lifestyle  . Physical activity    Days per week: Not on file    Minutes per session: Not on file  . Stress: Not on file  Relationships  . Social Herbalist on phone:  Not on file    Gets together: Not on file    Attends religious service: Not on file    Active member of club or organization: Not on file    Attends meetings of clubs or organizations: Not on file    Relationship status: Not on file  Other Topics Concern  . Not on file  Social History Narrative  . Not on file   Family History  Problem Relation Age of Onset  . Diabetes Father   . Heart disease Father    No Known Allergies Prior to Admission medications   Medication Sig Start Date End Date Taking? Authorizing Provider  Multiple Vitamin (MULTIVITAMIN WITH MINERALS) TABS Take 1 tablet by mouth daily.   Yes [provider]  Omega-3 Fatty Acids (OMEGA-3 FISH OIL PO) Take 1 capsule by mouth daily at 12 noon.    Yes [provider]  oxyCODONE-acetaminophen (PERCOCET) 10-325 MG tablet Take 1 tablet by mouth daily as needed for pain.    Yes [provider]  naproxen (EC NAPROSYN) 500 MG EC tablet Take 500 mg by mouth 2 (two) times daily with a meal.    [provider]   No results found.  Positive ROS:  All other systems have been reviewed and were otherwise negative with the exception of those mentioned in the HPI and as above.  Physical Exam: General: Alert, no acute distress Cardiovascular: No pedal edema Respiratory: No cyanosis, no use of accessory musculature GI: No organomegaly, abdomen is soft and non-tender Skin: No lesions in the area of chief complaint Neurologic: Sensation intact distally Psychiatric: Patient is competent for consent with normal mood and affect Lymphatic: No axillary or cervical lymphadenopathy  MUSCULOSKELETAL:  Right shoulder and arm demonstrate intact skin with no open wounds.  Neurovascularly intact throughout.  Assessment: 1.  Right shoulder rotator cuff tear of supraspinatus, infraspinatus, and subscapularis 2.  Right shoulder  proximal biceps tendinitis and instability 3.  Right shoulder impingement 4.  Right  shoulder acromioclavicular joint arthritis  Plan: -Our plan is for arthroscopic interventions on the right shoulder today to include rotator cuff repair, biceps tenodesis, subacromial decompression, and distal clavicle resection.  We have previously discussed the risk, benefits, and indications of this procedure at length.  We have discussed the risk of bleeding, infection, damage to surrounding neurovascular structures, development of stiffness, arthritis, failure of repairs, the need for further surgery, and the risk of anesthesia.  He has provided informed consent to proceed.  -We will monitor him closely postoperatively due to his noncompliance with CPAP and his chronic obstructive sleep apnea.  However, our plan will certainly be for discharge home from PACU postoperatively.  -He will be nonweightbearing in a sling to the right arm.    Yolonda Kida, MD Cell [phone]    10/23/2018 12:19 PM

## 2018-10-23 NOTE — Progress Notes (Signed)
Orthopedic Tech Progress Note Patient Details:  Jordan Shaffer October 27, 1970 [id number]  Ortho Devices Type of Ortho Device: Abduction pillow Ortho Device/Splint Location: Glorianne [place] joy Ortho Device/Splint Interventions: Application   Post Interventions Patient Tolerated: Well Instructions Provided: Care of device   [place] Pink 10/23/2018, 2:57 PM

## 2018-10-24 ENCOUNTER — Encounter (HOSPITAL_COMMUNITY): Payer: Self-pay | Admitting: Orthopedic Surgery

## 2018-10-24 NOTE — Anesthesia Postprocedure Evaluation (Signed)
Anesthesia Post Note  Patient: Jordan Shaffer  Procedure(s) Performed: Right shoulder arthroscopic rotator cuff repair, biceps tenodesis, subacromial decompression, distal clavicle resection (Right Shoulder)     Patient location during evaluation: PACU Anesthesia Type: General Level of consciousness: awake and alert Pain management: pain level controlled Vital Signs Assessment: post-procedure vital signs reviewed and stable Respiratory status: spontaneous breathing, nonlabored ventilation and respiratory function stable Cardiovascular status: blood pressure returned to baseline and stable Postop Assessment: no apparent nausea or vomiting Anesthetic complications: no    Last Vitals:  Vitals:   10/23/18 1630 10/23/18 1636  BP:  117/82  Pulse:  (!) 58  Resp:  16  Temp: (!) 36.4 C   SpO2:  96%    Last Pain:  Vitals:   10/23/18 1507  TempSrc:   PainSc: Asleep   Pain Goal:                   Lidia Collum

## 2019-03-31 ENCOUNTER — Ambulatory Visit: Payer: Self-pay | Admitting: Dermatology

## 2019-12-02 ENCOUNTER — Other Ambulatory Visit: Payer: Self-pay | Admitting: Neurology

## 2019-12-02 DIAGNOSIS — R569 Unspecified convulsions: Secondary | ICD-10-CM

## 2019-12-16 ENCOUNTER — Inpatient Hospital Stay: Admission: RE | Admit: 2019-12-16 | Payer: Managed Care, Other (non HMO) | Source: Ambulatory Visit

## 2019-12-22 ENCOUNTER — Other Ambulatory Visit: Payer: Self-pay

## 2019-12-22 ENCOUNTER — Ambulatory Visit
Admission: RE | Admit: 2019-12-22 | Discharge: 2019-12-22 | Disposition: A | Discharge status: Home / Self Care | Payer: Managed Care, Other (non HMO) | Source: Ambulatory Visit | Attending: Neurology | Admitting: Neurology

## 2019-12-22 DIAGNOSIS — R569 Unspecified convulsions: Secondary | ICD-10-CM | POA: Insufficient documentation

## 2019-12-22 MED ORDER — GADOBUTROL 1 MMOL/ML IV SOLN
9.0000 mL | Freq: Once | INTRAVENOUS | Status: AC | PRN
  Administered 2019-12-22: 9 mL via INTRAVENOUS

## 2019-12-24 ENCOUNTER — Other Ambulatory Visit
Admission: RE | Admit: 2019-12-24 | Discharge: 2019-12-24 | Disposition: A | Discharge status: Home / Self Care | Payer: Managed Care, Other (non HMO) | Source: Ambulatory Visit | Attending: Internal Medicine | Admitting: Internal Medicine

## 2019-12-24 DIAGNOSIS — Z20822 Contact with and (suspected) exposure to covid-19: Secondary | ICD-10-CM | POA: Insufficient documentation

## 2019-12-24 DIAGNOSIS — Z01812 Encounter for preprocedural laboratory examination: Secondary | ICD-10-CM | POA: Insufficient documentation

## 2019-12-25 LAB — SARS CORONAVIRUS 2 (TAT 6-24 HRS): SARS Coronavirus 2: NEGATIVE

## 2021-02-13 IMAGING — MR MR HEAD WO/W CM
14 of 18 series · 38 of 48 positions shown · IV contrast (gadavist)
Comparison: None.

CLINICAL DATA: Memory loss.

EXAM:
MRI HEAD WITHOUT AND WITH CONTRAST
TECHNIQUE: Multiplanar, multiecho pulse sequences of the brain and surrounding
structures were obtained without and with intravenous contrast.
CONTRAST:  9mL GADAVIST GADOBUTROL 1 MMOL/ML IV SOLN

[Series 5: ax dwi_tracew · axial · 3.0mm · 0.60mm/px · z∈[-49,+101]mm · 4 of 96 slices shown]
[im 1/96]
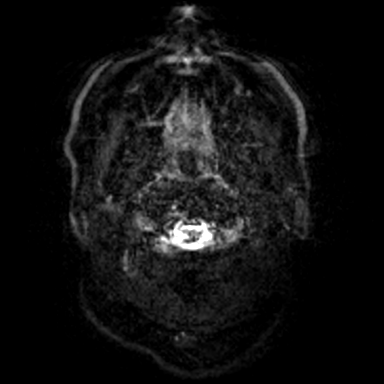
[im 32/96]
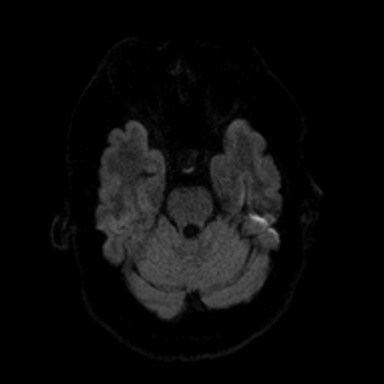
[im 64/96]
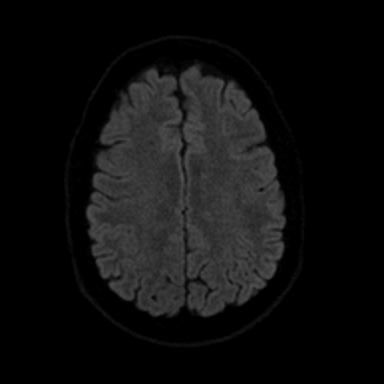
[im 96/96]
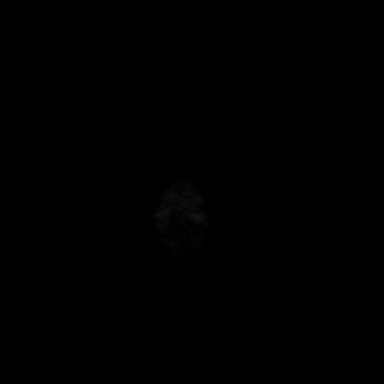

[Series 6: ax dwi_adc · axial · 3.0mm · 0.60mm/px · z∈[-49,+101]mm · 2 of 48 slices shown]
[im 1/48]
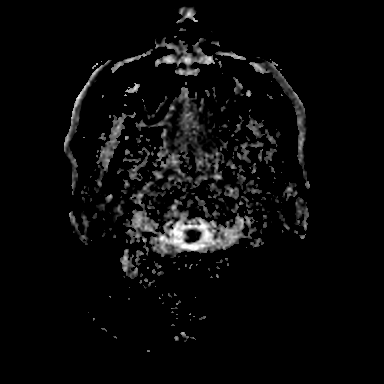
[im 48/48]
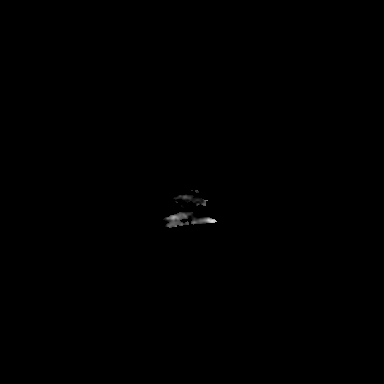

[Series 7: cor dwi_tracew · coronal · 5.0mm · 0.68mm/px · 3 of 80 slices shown]
[im 1/80]
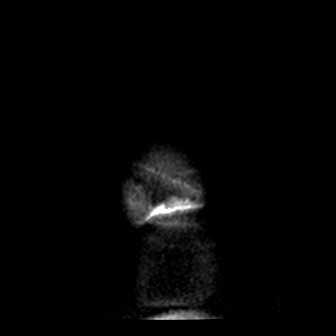
[im 40/80]
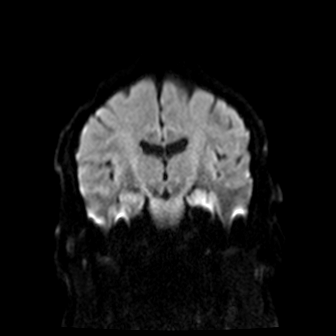
[im 80/80]
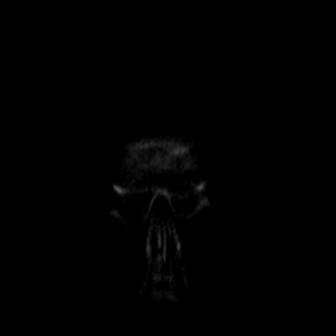

[Series 8: cor dwi_adc · coronal · 5.0mm · 0.68mm/px · 2 of 40 slices shown]
[im 1/40]
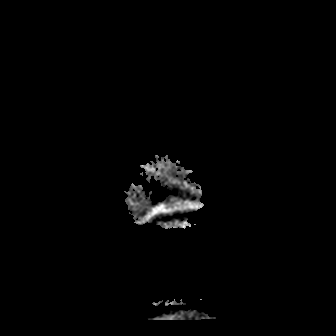
[im 40/40]
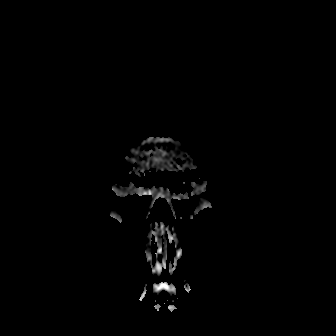

[Series 9: T1 · sagittal · 5.0mm · 0.62mm/px · 1 of 25 slices shown (1 of 3)]
[im 1/25]
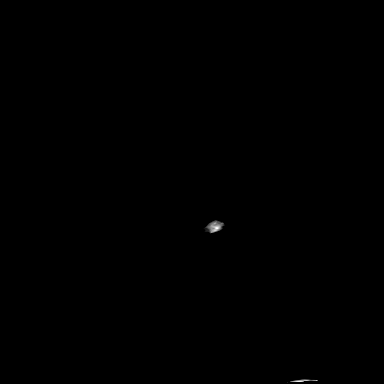

[Series 10: T2 · axial · 5.0mm · 0.53mm/px · 1 of 25 slices shown (1 of 2)]
[im 1/25]
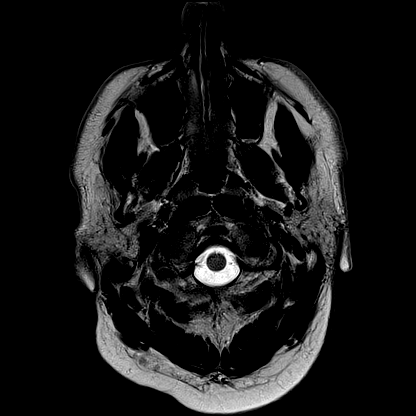

[Series 11: T2 · coronal · 3.0mm · 0.23mm/px · 2 of 35 slices shown (2 of 2)]
[im 1/35]
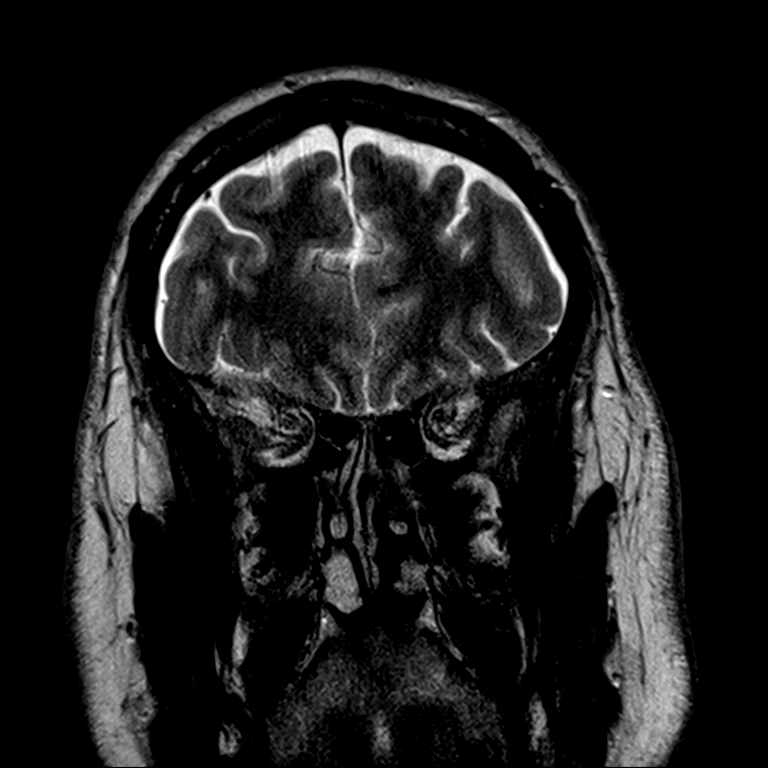
[im 35/35]
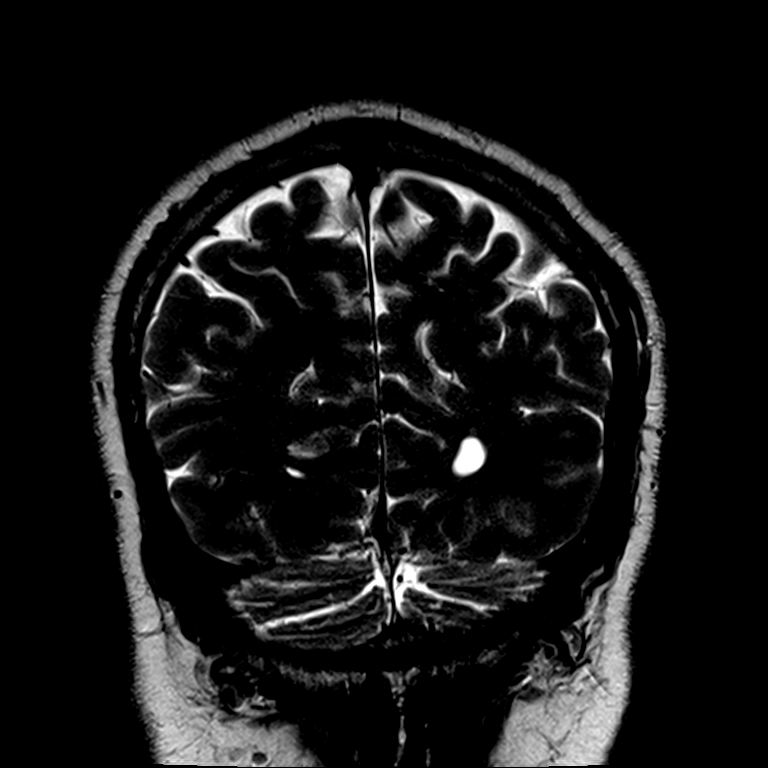

[Series 12: FLAIR · coronal · 3.0mm · 0.35mm/px · 2 of 35 slices shown (1 of 2)]
[im 1/35]
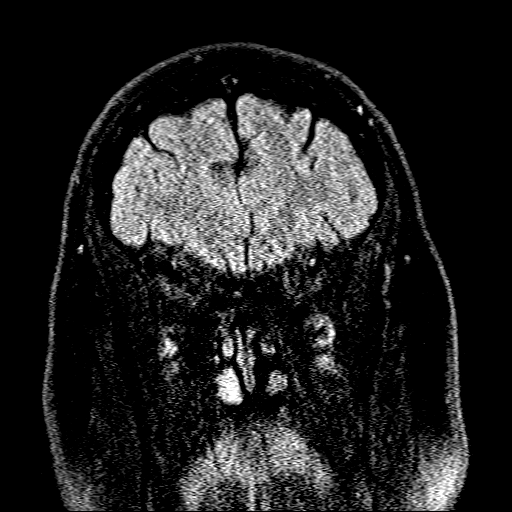
[im 35/35]
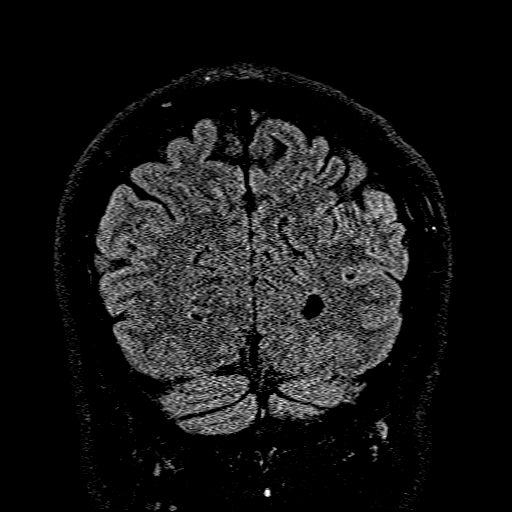

[Series 13: mag_images · axial · 3.0mm · 0.90mm/px · 1 of 60 slices shown]
[im 1/60]
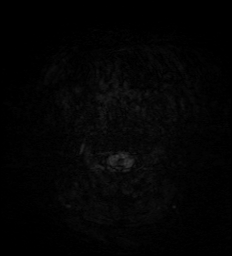

[Series 17: FLAIR · axial · 3.0mm · 0.53mm/px · z∈[-50,+106]mm · 2 of 55 slices shown (2 of 2)]
[im 1/55]
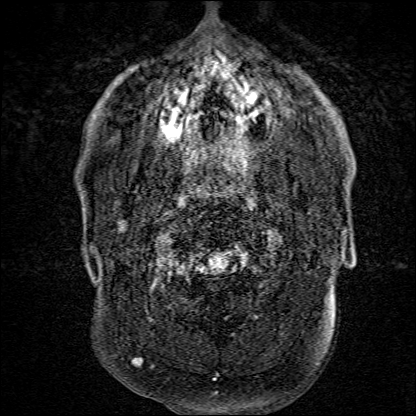
[im 55/55]
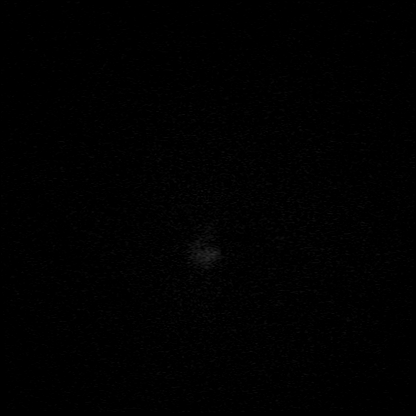

[Series 18: T1 · axial · 1.0mm · 0.98mm/px · z∈[-65,+107]mm · 8 of 176 slices shown (2 of 3)]
[im 1/176]
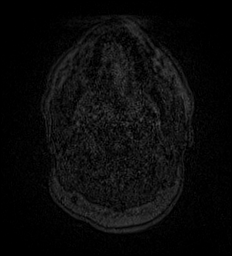
[im 26/176]
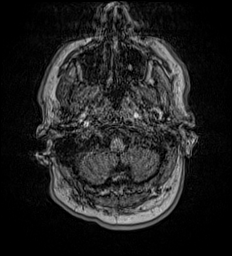
[im 51/176]
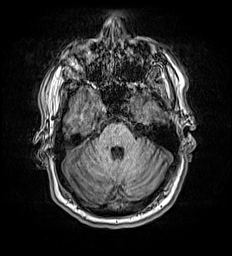
[im 76/176]
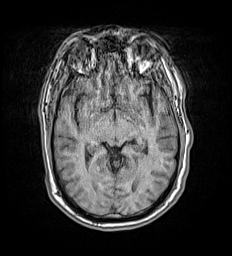
[im 101/176]
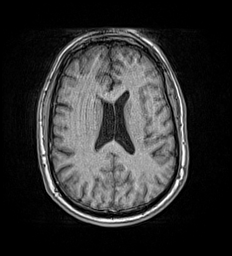
[im 126/176]
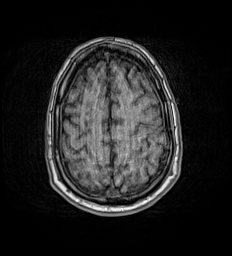
[im 151/176]
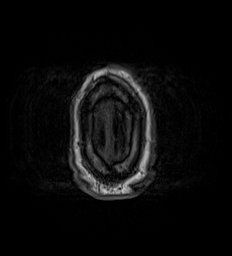
[im 176/176]
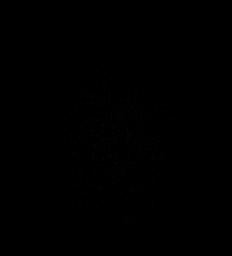

[Series 20: T1 post-contrast · axial · 1.0mm · 0.98mm/px · z∈[-57,+113]mm · 8 of 176 slices shown (1 of 2)]
[im 1/176]
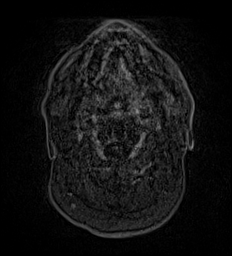
[im 26/176]
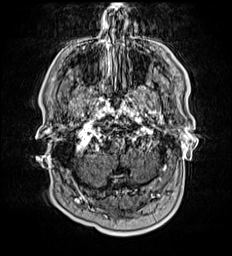
[im 51/176]
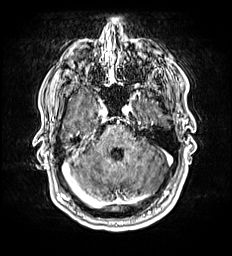
[im 76/176]
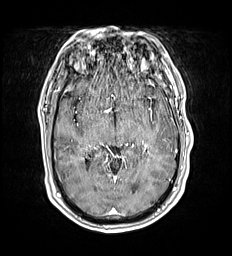
[im 101/176]
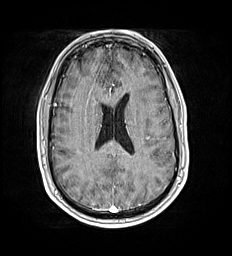
[im 126/176]
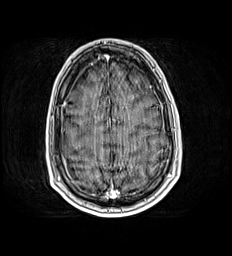
[im 151/176]
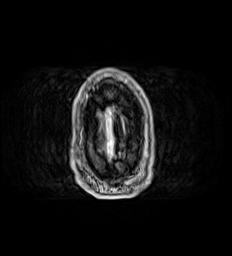
[im 176/176]
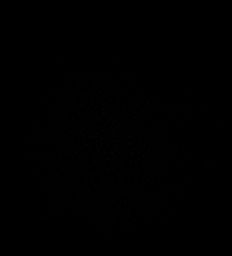

[Series 21: T1 post-contrast · coronal · 5.0mm · 0.57mm/px · 1 of 29 slices shown (2 of 2)]
[im 1/29]
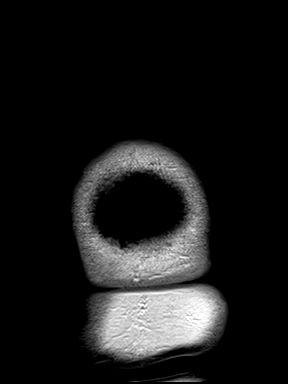

[Series 22: T1 · axial · 5.0mm · 0.90mm/px · 1 of 27 slices shown (3 of 3)]
[im 1/27]
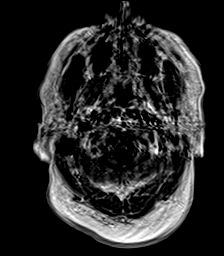

[38 of 48 positions shown; findings below may reference images not displayed]

FINDINGS: The study is partially degraded by motion, particularly the
postcontrast images.

Brain: No acute infarction, hemorrhage, hydrocephalus, extra-axial
collection or mass lesion. The brain parenchyma has normal
morphology and signal characteristics. Mesial temporal lobes are
symmetric. No focus of abnormal contrast enhancement.

Vascular: Normal flow voids.

Skull and upper cervical spine: Normal marrow signal.

Sinuses/Orbits: Negative.
IMPRESSION: Unremarkable MRI of the brain.

## 2022-08-02 ENCOUNTER — Ambulatory Visit: Payer: Managed Care, Other (non HMO) | Admitting: Dermatology

## 2022-08-03 ENCOUNTER — Ambulatory Visit: Payer: Managed Care, Other (non HMO) | Admitting: Dermatology

## 2022-08-03 VITALS — BP 102/70 | HR 66

## 2022-08-03 DIAGNOSIS — Z1283 Encounter for screening for malignant neoplasm of skin: Secondary | ICD-10-CM | POA: Diagnosis not present

## 2022-08-03 DIAGNOSIS — L821 Other seborrheic keratosis: Secondary | ICD-10-CM

## 2022-08-03 DIAGNOSIS — L570 Actinic keratosis: Secondary | ICD-10-CM | POA: Diagnosis not present

## 2022-08-03 DIAGNOSIS — L578 Other skin changes due to chronic exposure to nonionizing radiation: Secondary | ICD-10-CM | POA: Diagnosis not present

## 2022-08-03 DIAGNOSIS — W908XXA Exposure to other nonionizing radiation, initial encounter: Secondary | ICD-10-CM

## 2022-08-03 DIAGNOSIS — D229 Melanocytic nevi, unspecified: Secondary | ICD-10-CM

## 2022-08-03 DIAGNOSIS — L814 Other melanin hyperpigmentation: Secondary | ICD-10-CM

## 2022-08-03 DIAGNOSIS — L820 Inflamed seborrheic keratosis: Secondary | ICD-10-CM

## 2022-08-03 DIAGNOSIS — D1801 Hemangioma of skin and subcutaneous tissue: Secondary | ICD-10-CM

## 2022-08-03 NOTE — Progress Notes (Signed)
Follow-Up Visit   Subjective  Jordan Shaffer is a 52 y.o. male who presents for the following: Skin Cancer Screening and Full Body Skin Exam The patient presents for Total-Body Skin Exam (TBSE) for skin cancer screening and mole check. The patient has spots, moles and lesions to be evaluated, some may be new or changing and the patient may have concern these could be cancer.  The following portions of the chart were reviewed this encounter and updated as appropriate: medications, allergies, medical history  Review of Systems:  No other skin or systemic complaints except as noted in HPI or Assessment and Plan.  Objective  Well appearing patient in no apparent distress; mood and affect are within normal limits.  A full examination was performed including scalp, head, eyes, ears, nose, lips, neck, chest, axillae, abdomen, back, buttocks, bilateral upper extremities, bilateral lower extremities, hands, feet, fingers, toes, fingernails, and toenails. All findings within normal limits unless otherwise noted below.   Relevant physical exam findings are noted in the Assessment and Plan.  Left Ear x 1 right lateral nasal bridge x 1, left superior helix x 2 (4) Erythematous thin papules/macules with gritty scale.   right lower medial eyelid margin 0.3 cm waxy papule    Assessment & Plan   SKIN CANCER SCREENING PERFORMED TODAY.  ACTINIC DAMAGE - Chronic condition, secondary to cumulative UV/sun exposure - diffuse scaly erythematous macules with underlying dyspigmentation - Recommend daily broad spectrum sunscreen SPF 30+ to sun-exposed areas, reapply every 2 hours as needed.  - Staying in the shade or wearing long sleeves, sun glasses (UVA+UVB protection) and wide brim hats (4-inch brim around the entire circumference of the hat) are also recommended for sun protection.  - Call for new or changing lesions.  LENTIGINES, SEBORRHEIC KERATOSES, HEMANGIOMAS - Benign normal skin lesions -  Benign-appearing - Call for any changes  MELANOCYTIC NEVI - Tan-brown and/or pink-flesh-colored symmetric macules and papules - Benign appearing on exam today - Observation - Call clinic for new or changing moles - Recommend daily use of broad spectrum spf 30+ sunscreen to sun-exposed areas.   Actinic keratosis (4) Left Ear x 1 right lateral nasal bridge x 1, left superior helix x 2  Recheck left helix at next follow up   Actinic keratoses are precancerous spots that appear secondary to cumulative UV radiation exposure/sun exposure over time. They are chronic with expected duration over 1 year. A portion of actinic keratoses will progress to squamous cell carcinoma of the skin. It is not possible to reliably predict which spots will progress to skin cancer and so treatment is recommended to prevent development of skin cancer.  Recommend daily broad spectrum sunscreen SPF 30+ to sun-exposed areas, reapply every 2 hours as needed.  Recommend staying in the shade or wearing long sleeves, sun glasses (UVA+UVB protection) and wide brim hats (4-inch brim around the entire circumference of the hat). Call for new or changing lesions.  Destruction of lesion - Left Ear x 1 right lateral nasal bridge x 1, left superior helix x 2 (4) Complexity: simple   Destruction method: cryotherapy   Informed consent: discussed and consent obtained   Timeout:  patient name, date of birth, surgical site, and procedure verified Lesion destroyed using liquid nitrogen: Yes   Region frozen until ice ball extended beyond lesion: Yes   Outcome: patient tolerated procedure well with no complications   Post-procedure details: wound care instructions given    Inflamed seborrheic keratosis right lower medial eyelid  margin Discussed treatment. Patient deferred treatment Will recheck at next follow up  Return for 6 month aks , 1 year tbse .  IAsher Muir, CMA, am acting as scribe for Armida Sans,  MD.  Documentation: I have reviewed the above documentation for accuracy and completeness, and I agree with the above.  Armida Sans, MD

## 2022-08-03 NOTE — Patient Instructions (Addendum)
Actinic keratoses are precancerous spots that appear secondary to cumulative UV radiation exposure/sun exposure over time. They are chronic with expected duration over 1 year. A portion of actinic keratoses will progress to squamous cell carcinoma of the skin. It is not possible to reliably predict which spots will progress to skin cancer and so treatment is recommended to prevent development of skin cancer.  Recommend daily broad spectrum sunscreen SPF 30+ to sun-exposed areas, reapply every 2 hours as needed.  Recommend staying in the shade or wearing long sleeves, sun glasses (UVA+UVB protection) and wide brim hats (4-inch brim around the entire circumference of the hat). Call for new or changing lesions.    Cryotherapy Aftercare  Wash gently with soap and water everyday.   Apply Vaseline and Band-Aid daily until healed.        Melanoma ABCDEs  Melanoma is the most dangerous type of skin cancer, and is the leading cause of death from skin disease.  You are more likely to develop melanoma if you: Have light-colored skin, light-colored eyes, or red or blond hair Spend a lot of time in the sun Tan regularly, either outdoors or in a tanning bed Have had blistering sunburns, especially during childhood Have a close family member who has had a melanoma Have atypical moles or large birthmarks  Early detection of melanoma is key since treatment is typically straightforward and cure rates are extremely high if we catch it early.   The first sign of melanoma is often a change in a mole or a new dark spot.  The ABCDE system is a way of remembering the signs of melanoma.  A for asymmetry:  The two halves do not match. B for border:  The edges of the growth are irregular. C for color:  A mixture of colors are present instead of an even brown color. D for diameter:  Melanomas are usually (but not always) greater than 6mm - the size of a pencil eraser. E for evolution:  The spot keeps  changing in size, shape, and color.  Please check your skin once per month between visits. You can use a small mirror in front and a large mirror behind you to keep an eye on the back side or your body.   If you see any new or changing lesions before your next follow-up, please call to schedule a visit.  Please continue daily skin protection including broad spectrum sunscreen SPF 30+ to sun-exposed areas, reapplying every 2 hours as needed when you're outdoors.   Staying in the shade or wearing long sleeves, sun glasses (UVA+UVB protection) and wide brim hats (4-inch brim around the entire circumference of the hat) are also recommended for sun protection.    Due to recent changes in healthcare laws, you may see results of your pathology and/or laboratory studies on MyChart before the doctors have had a chance to review them. We understand that in some cases there may be results that are confusing or concerning to you. Please understand that not all results are received at the same time and often the doctors may need to interpret multiple results in order to provide you with the best plan of care or course of treatment. Therefore, we ask that you please give us 2 business days to thoroughly review all your results before contacting the office for clarification. Should we see a critical lab result, you will be contacted sooner.   If You Need Anything After Your Visit  If you have any questions   or concerns for your doctor, please call our main line at [phone] and press option 4 to reach your doctor's medical assistant. If no one answers, please leave a voicemail as directed and we will return your call as soon as possible. Messages left after 4 pm will be answered the following business day.   You may also send us a message via MyChart. We typically respond to MyChart messages within 1-2 business days.  For prescription refills, please ask your pharmacy to contact our office. Our fax number is  [phone].  If you have an urgent issue when the clinic is closed that cannot wait until the next business day, you can page your doctor at the number below.    Please note that while we do our best to be available for urgent issues outside of office hours, we are not available 24/7.   If you have an urgent issue and are unable to reach us, you may choose to seek medical care at your doctor's office, retail clinic, urgent care center, or emergency room.  If you have a medical emergency, please immediately call 911 or go to the emergency department.  Pager Numbers  - Dr. Kowalski: [phone]  - Dr. Moye: [phone]  - Dr. Stewart: [phone]  In the event of inclement weather, please call our main line at [phone] for an update on the status of any delays or closures.  Dermatology Medication Tips: Please keep the boxes that topical medications come in in order to help keep track of the instructions about where and how to use these. Pharmacies typically print the medication instructions only on the boxes and not directly on the medication tubes.   If your medication is too expensive, please contact our office at [phone] option 4 or send us a message through MyChart.   We are unable to tell what your co-pay for medications will be in advance as this is different depending on your insurance coverage. However, we may be able to find a substitute medication at lower cost or fill out paperwork to get insurance to cover a needed medication.   If a prior authorization is required to get your medication covered by your insurance company, please allow us 1-2 business days to complete this process.  Drug prices often vary depending on where the prescription is filled and some pharmacies may offer cheaper prices.  The website www.goodrx.com contains coupons for medications through different pharmacies. The prices here do not account for what the cost may be with help from  insurance (it may be cheaper with your insurance), but the website can give you the price if you did not use any insurance.  - You can print the associated coupon and take it with your prescription to the pharmacy.  - You may also stop by our office during regular business hours and pick up a GoodRx coupon card.  - If you need your prescription sent electronically to a different pharmacy, notify our office through [facility] MyChart or by phone at [phone] option 4.     Si Usted Necesita Algo Despus de Su Visita  Tambin puede enviarnos un mensaje a travs de MyChart. Por lo general respondemos a los mensajes de MyChart en el transcurso de 1 a 2 das hbiles.  Para renovar recetas, por favor pida a su farmacia que se ponga en contacto con nuestra oficina. Nuestro nmero de fax es el [phone].  Si tiene un asunto urgente cuando la clnica est cerrada y que no puede   esperar hasta el siguiente da hbil, puede llamar/localizar a su doctor(a) al nmero que aparece a continuacin.   Por favor, tenga en cuenta que aunque hacemos todo lo posible para estar disponibles para asuntos urgentes fuera del horario de oficina, no estamos disponibles las 24 horas del da, los 7 das de la semana.   Si tiene un problema urgente y no puede comunicarse con nosotros, puede optar por buscar atencin mdica  en el consultorio de su doctor(a), en una clnica privada, en un centro de atencin urgente o en una sala de emergencias.  Si tiene una emergencia mdica, por favor llame inmediatamente al 911 o vaya a la sala de emergencias.  Nmeros de bper  - Dr. Kowalski: [phone]  - Dra. Moye: [phone]  - Dra. Stewart: [phone]  En caso de inclemencias del tiempo, por favor llame a nuestra lnea principal al [phone] para una actualizacin sobre el estado de cualquier retraso o cierre.  Consejos para la medicacin en dermatologa: Por favor, guarde las cajas en las que vienen los  medicamentos de uso tpico para ayudarle a seguir las instrucciones sobre dnde y cmo usarlos. Las farmacias generalmente imprimen las instrucciones del medicamento slo en las cajas y no directamente en los tubos del medicamento.   Si su medicamento es muy caro, por favor, pngase en contacto con nuestra oficina llamando al [phone] y presione la opcin 4 o envenos un mensaje a travs de MyChart.   No podemos decirle cul ser su copago por los medicamentos por adelantado ya que esto es diferente dependiendo de la cobertura de su seguro. Sin embargo, es posible que podamos encontrar un medicamento sustituto a menor costo o llenar un formulario para que el seguro cubra el medicamento que se considera necesario.   Si se requiere una autorizacin previa para que su compaa de seguros cubra su medicamento, por favor permtanos de 1 a 2 das hbiles para completar este proceso.  Los precios de los medicamentos varan con frecuencia dependiendo del lugar de dnde se surte la receta y alguna farmacias pueden ofrecer precios ms baratos.  El sitio web www.goodrx.com tiene cupones para medicamentos de diferentes farmacias. Los precios aqu no tienen en cuenta lo que podra costar con la ayuda del seguro (puede ser ms barato con su seguro), pero el sitio web puede darle el precio si no utiliz ningn seguro.  - Puede imprimir el cupn correspondiente y llevarlo con su receta a la farmacia.  - Tambin puede pasar por nuestra oficina durante el horario de atencin regular y recoger una tarjeta de cupones de GoodRx.  - Si necesita que su receta se enve electrnicamente a una farmacia diferente, informe a nuestra oficina a travs de MyChart de [facility] o por telfono llamando al [phone] y presione la opcin 4.  

## 2022-08-07 ENCOUNTER — Encounter: Payer: Self-pay | Admitting: Dermatology

## 2023-02-07 ENCOUNTER — Ambulatory Visit: Payer: Managed Care, Other (non HMO) | Admitting: Dermatology

## 2023-03-08 ENCOUNTER — Ambulatory Visit: Payer: Managed Care, Other (non HMO) | Admitting: Dermatology

## 2023-03-09 ENCOUNTER — Ambulatory Visit: Payer: Managed Care, Other (non HMO)

## 2023-03-09 DIAGNOSIS — Z980 Intestinal bypass and anastomosis status: Secondary | ICD-10-CM | POA: Diagnosis not present

## 2023-03-09 DIAGNOSIS — Z08 Encounter for follow-up examination after completed treatment for malignant neoplasm: Secondary | ICD-10-CM | POA: Diagnosis present

## 2023-03-09 DIAGNOSIS — Z85048 Personal history of other malignant neoplasm of rectum, rectosigmoid junction, and anus: Secondary | ICD-10-CM | POA: Diagnosis not present

## 2023-03-09 DIAGNOSIS — K5730 Diverticulosis of large intestine without perforation or abscess without bleeding: Secondary | ICD-10-CM | POA: Diagnosis not present

## 2023-03-09 DIAGNOSIS — D124 Benign neoplasm of descending colon: Secondary | ICD-10-CM | POA: Diagnosis not present

## 2023-08-29 ENCOUNTER — Ambulatory Visit: Payer: Managed Care, Other (non HMO) | Admitting: Dermatology

## 2023-09-04 ENCOUNTER — Ambulatory Visit: Admitting: Dermatology

## 2024-01-21 ENCOUNTER — Other Ambulatory Visit: Payer: Self-pay

## 2024-01-21 DIAGNOSIS — Z5321 Procedure and treatment not carried out due to patient leaving prior to being seen by health care provider: Secondary | ICD-10-CM | POA: Insufficient documentation

## 2024-01-21 DIAGNOSIS — K625 Hemorrhage of anus and rectum: Secondary | ICD-10-CM | POA: Insufficient documentation

## 2024-01-21 LAB — COMPREHENSIVE METABOLIC PANEL WITH GFR
ALT: 34 U/L (ref 0–44)
AST: 36 U/L (ref 15–41)
Albumin: 4.6 g/dL (ref 3.5–5.0)
Alkaline Phosphatase: 59 U/L (ref 38–126)
Anion gap: 12 (ref 5–15)
BUN: 17 mg/dL (ref 6–20)
CO2: 20 mmol/L — ABNORMAL LOW (ref 22–32)
Calcium: 9.8 mg/dL (ref 8.9–10.3)
Chloride: 104 mmol/L (ref 98–111)
Creatinine, Ser: 1.04 mg/dL (ref 0.61–1.24)
GFR, Estimated: >60 mL/min
Glucose, Bld: 113 mg/dL — ABNORMAL HIGH (ref 70–99)
Potassium: 4.3 mmol/L (ref 3.5–5.1)
Sodium: 137 mmol/L (ref 135–145)
Total Bilirubin: 0.5 mg/dL (ref 0.0–1.2)
Total Protein: 8.1 g/dL (ref 6.5–8.1)

## 2024-01-21 LAB — CBC
HCT: 43.6 % (ref 39.0–52.0)
Hemoglobin: 15.7 g/dL (ref 13.0–17.0)
MCH: 33.5 pg (ref 26.0–34.0)
MCHC: 36 g/dL (ref 30.0–36.0)
MCV: 93.2 fL (ref 80.0–100.0)
Platelets: 315 K/uL (ref 150–400)
RBC: 4.68 MIL/uL (ref 4.22–5.81)
RDW: 11.8 % (ref 11.5–15.5)
WBC: 9 K/uL (ref 4.0–10.5)
nRBC: 0 % (ref 0.0–0.2)

## 2024-01-21 LAB — LIPASE, BLOOD: Lipase: 78 U/L — ABNORMAL HIGH (ref 11–51)

## 2024-01-21 NOTE — ED Triage Notes (Signed)
 Pt reports rectal bleeding that began tonight, pt has had diarrhea for past 2 days. 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 Pt reports pain in rectum and abd.

## 2024-01-22 ENCOUNTER — Emergency Department
Admission: EM | Admit: 2024-01-22 | Discharge: 2024-01-22 | Attending: Emergency Medicine | Admitting: Emergency Medicine

## 2024-01-22 ENCOUNTER — Other Ambulatory Visit

## 2024-02-01 ENCOUNTER — Ambulatory Visit

## 2024-02-01 DIAGNOSIS — Z08 Encounter for follow-up examination after completed treatment for malignant neoplasm: Secondary | ICD-10-CM | POA: Diagnosis present

## 2024-02-01 DIAGNOSIS — K529 Noninfective gastroenteritis and colitis, unspecified: Secondary | ICD-10-CM | POA: Diagnosis not present

## 2024-02-01 DIAGNOSIS — Z85048 Personal history of other malignant neoplasm of rectum, rectosigmoid junction, and anus: Secondary | ICD-10-CM | POA: Diagnosis not present

## 2024-02-01 DIAGNOSIS — K5730 Diverticulosis of large intestine without perforation or abscess without bleeding: Secondary | ICD-10-CM | POA: Diagnosis not present

## 2024-02-01 DIAGNOSIS — Z980 Intestinal bypass and anastomosis status: Secondary | ICD-10-CM | POA: Diagnosis not present

## 2024-02-01 DIAGNOSIS — R140 Abdominal distension (gaseous): Secondary | ICD-10-CM | POA: Diagnosis not present
